# Patient Record
Sex: Female | Born: 1995 | Hispanic: No | Marital: Single | Smoking: Never smoker
Health system: Southern US, Community
[De-identification: ages and names within clinical notes are randomized; demographics above are authoritative.]

## PROBLEM LIST (undated history)

## (undated) DIAGNOSIS — Z789 Other specified health status: Secondary | ICD-10-CM

## (undated) HISTORY — PX: TONSILLECTOMY: SUR1361

## (undated) HISTORY — DX: Other specified health status: Z78.9

---

## 2018-04-01 DIAGNOSIS — R634 Abnormal weight loss: Secondary | ICD-10-CM | POA: Diagnosis not present

## 2018-04-01 DIAGNOSIS — Z118 Encounter for screening for other infectious and parasitic diseases: Secondary | ICD-10-CM | POA: Diagnosis not present

## 2018-04-01 DIAGNOSIS — Z6830 Body mass index (BMI) 30.0-30.9, adult: Secondary | ICD-10-CM | POA: Diagnosis not present

## 2018-04-01 DIAGNOSIS — N942 Vaginismus: Secondary | ICD-10-CM | POA: Diagnosis not present

## 2018-05-06 DIAGNOSIS — R350 Frequency of micturition: Secondary | ICD-10-CM | POA: Diagnosis not present

## 2018-05-06 DIAGNOSIS — M6281 Muscle weakness (generalized): Secondary | ICD-10-CM | POA: Diagnosis not present

## 2018-05-06 DIAGNOSIS — M62838 Other muscle spasm: Secondary | ICD-10-CM | POA: Diagnosis not present

## 2018-05-06 DIAGNOSIS — N941 Unspecified dyspareunia: Secondary | ICD-10-CM | POA: Diagnosis not present

## 2018-05-12 DIAGNOSIS — J209 Acute bronchitis, unspecified: Secondary | ICD-10-CM | POA: Diagnosis not present

## 2018-05-14 ENCOUNTER — Emergency Department (HOSPITAL_COMMUNITY)
Admission: EM | Admit: 2018-05-14 | Discharge: 2018-05-14 | Disposition: A | Discharge status: Home / Self Care | Payer: Federal, State, Local not specified - PPO | Attending: Emergency Medicine | Admitting: Emergency Medicine

## 2018-05-14 ENCOUNTER — Other Ambulatory Visit: Payer: Self-pay

## 2018-05-14 ENCOUNTER — Encounter (HOSPITAL_COMMUNITY): Payer: Self-pay | Admitting: *Deleted

## 2018-05-14 DIAGNOSIS — R059 Cough, unspecified: Secondary | ICD-10-CM

## 2018-05-14 DIAGNOSIS — R05 Cough: Secondary | ICD-10-CM | POA: Diagnosis not present

## 2018-05-14 MED ORDER — BENZONATATE 100 MG PO CAPS: 200.0000 mg | ORAL_CAPSULE | Freq: Two times a day (BID) | ORAL | 0 refills | Status: DC | PRN

## 2018-05-14 MED ORDER — ALBUTEROL SULFATE HFA 108 (90 BASE) MCG/ACT IN AERS
2.0000 | INHALATION_SPRAY | RESPIRATORY_TRACT | Status: DC | PRN
  Administered 2018-05-14: 2 via RESPIRATORY_TRACT
  Filled 2018-05-14: qty 6.7

## 2018-05-14 NOTE — ED Triage Notes (Signed)
Pt stated "I went to [facility] [employer]. Yesterday and was told I have acute bronchitis.  I was given Z-pack and promethazine DM.  I feel like it's not working."  LS clear.

## 2018-05-14 NOTE — Discharge Instructions (Signed)
If you run a fever greater than 100.3 degrees, you will need to isolate for 72 hours or until your fever has resolved for greater than 72 hours.  You do not meet screening criteria for coronavirus.  Please monitor symptoms carefully and return if your symptoms worsen.

## 2018-05-14 NOTE — ED Provider Notes (Signed)
[facility]-EMERGENCY DEPT Provider Note   CSN: [id number] Arrival date & time: 05/14/18  0114    History   Chief Complaint Chief Complaint  Patient presents with  . URI    HPI Donna Mcfarland is a 23 y.o. female.     Patient presents to the emergency department with a chief complaint of cough.  She states that she was seen yesterday at [facility] and was diagnosed with bronchitis.  She had a chest x-ray performed at that time.  She states that she has had diarrhea after starting the antibiotic.  She denies any fever.  Denies any other associated symptoms.  She states that she has not had any relief of her symptoms.  Denies any known exposures to COVID-19.  The history is provided by the patient. No language interpreter was used.    History reviewed. No pertinent past medical history.  There are no active problems to display for this patient.   History reviewed. No pertinent surgical history.   OB History   No obstetric history on file.      Home Medications    Prior to Admission medications   Medication Sig Start Date End Date Taking? Authorizing Provider  benzonatate (TESSALON) 100 MG capsule Take 2 capsules (200 mg total) by mouth 2 (two) times daily as needed for cough. 05/14/18   Roxy Horseman, PA-C    Family History No family history on file.  Social History Social History   Tobacco Use  . Smoking status: Never Smoker  . Smokeless tobacco: Never Used  Substance Use Topics  . Alcohol use: Yes    Comment: socially  . Drug use: Yes    Frequency: 3.0 times per week    Types: Marijuana     Allergies   Patient has no allergy information on record.   Review of Systems Review of Systems  All other systems reviewed and are negative.    Physical Exam Updated Vital Signs BP 119/71 (BP Location: Left Arm)   Pulse 93   Temp 99.4 F (37.4 C) (Oral) Comment: 500mg  ibprofen 3 hours ago  Resp 18   Ht 5\' 5"  (1.651 m)   Wt  70.3 kg   LMP 04/04/2018 (Approximate)   SpO2 98%   BMI 25.79 kg/m   Physical Exam Physical Exam  Constitutional: Pt  is oriented to person, place, and time. Appears well-developed and well-nourished. No distress.  HENT:  Head: Normocephalic and atraumatic.  Right Ear: Tympanic membrane, external ear and ear canal normal.  Left Ear: Tympanic membrane, external ear and ear canal normal.  Nose: Mucosal edema and mild rhinorrhea present. No epistaxis. Right sinus exhibits no maxillary sinus tenderness and no frontal sinus tenderness. Left sinus exhibits no maxillary sinus tenderness and no frontal sinus tenderness.  Mouth/Throat: Uvula is midline and mucous membranes are normal. Mucous membranes are not pale and not cyanotic. No oropharyngeal exudate, posterior oropharyngeal edema, posterior oropharyngeal erythema or tonsillar abscesses.  Eyes: Conjunctivae are normal. Pupils are equal, round, and reactive to light.  Neck: Normal range of motion and full passive range of motion without pain.  Cardiovascular: Normal rate and intact distal pulses.   Pulmonary/Chest: Effort normal and breath sounds normal. No stridor.  Clear and equal breath sounds without focal wheezes, rhonchi, rales  Abdominal: Soft. Bowel sounds are normal. There is no tenderness.  Musculoskeletal: Normal range of motion.  Lymphadenopathy:    Pthas no cervical adenopathy.  Neurological: Pt is alert and oriented to  person, place, and time.  Skin: Skin is warm and dry. No rash noted. Pt is not diaphoretic.  Psychiatric: Normal mood and affect.  Nursing note and vitals reviewed.    ED Treatments / Results  Labs (all labs ordered are listed, but only abnormal results are displayed) Labs Reviewed - No data to display  EKG None  Radiology No results found.  Procedures Procedures (including critical care time)  Medications Ordered in ED Medications  albuterol (PROVENTIL HFA;VENTOLIN HFA) 108 (90 Base) MCG/ACT  inhaler 2 puff (has no administration in time range)     Initial Impression / Assessment and Plan / ED Course  I have reviewed the triage vital signs and the nursing notes.  Pertinent labs & imaging results that were available during my care of the patient were reviewed by me and considered in my medical decision making (see chart for details).        Patient with cough.  Had chest x-ray performed at urgent care and was prescribed azithromycin.  She is not hypoxic, she is afebrile.  I have a low suspicion for coronavirus.  Instructions to self isolate until she feels better or until 72 hours after resolution of any possible fever.  Final Clinical Impressions(s) / ED Diagnoses   Final diagnoses:  Cough    ED Discharge Orders         Ordered    benzonatate (TESSALON) 100 MG capsule  2 times daily PRN     05/14/18 0224           Roxy Horseman, PA-C 05/14/18 7902    Derwood Kaplan, MD 05/14/18 4097

## 2018-05-24 DIAGNOSIS — L03011 Cellulitis of right finger: Secondary | ICD-10-CM | POA: Diagnosis not present

## 2018-06-01 ENCOUNTER — Other Ambulatory Visit: Payer: Self-pay

## 2018-06-01 DIAGNOSIS — Z20828 Contact with and (suspected) exposure to other viral communicable diseases: Secondary | ICD-10-CM | POA: Insufficient documentation

## 2018-06-01 DIAGNOSIS — J111 Influenza due to unidentified influenza virus with other respiratory manifestations: Secondary | ICD-10-CM | POA: Diagnosis not present

## 2018-06-01 DIAGNOSIS — R000 Tachycardia, unspecified: Secondary | ICD-10-CM | POA: Diagnosis not present

## 2018-06-01 DIAGNOSIS — R509 Fever, unspecified: Secondary | ICD-10-CM | POA: Diagnosis not present

## 2018-06-02 ENCOUNTER — Other Ambulatory Visit: Payer: Self-pay

## 2018-06-02 ENCOUNTER — Encounter (HOSPITAL_COMMUNITY): Payer: Self-pay | Admitting: *Deleted

## 2018-06-02 ENCOUNTER — Emergency Department (HOSPITAL_COMMUNITY): Payer: Federal, State, Local not specified - PPO

## 2018-06-02 ENCOUNTER — Emergency Department (HOSPITAL_COMMUNITY)
Admission: EM | Admit: 2018-06-02 | Discharge: 2018-06-02 | Disposition: A | Discharge status: Home / Self Care | Payer: Federal, State, Local not specified - PPO | Attending: Emergency Medicine | Admitting: Emergency Medicine

## 2018-06-02 DIAGNOSIS — R69 Illness, unspecified: Secondary | ICD-10-CM

## 2018-06-02 DIAGNOSIS — R509 Fever, unspecified: Secondary | ICD-10-CM | POA: Diagnosis not present

## 2018-06-02 DIAGNOSIS — J111 Influenza due to unidentified influenza virus with other respiratory manifestations: Secondary | ICD-10-CM

## 2018-06-02 LAB — CBC WITH DIFFERENTIAL/PLATELET
Abs Immature Granulocytes: 0.03 10*3/uL (ref 0.00–0.07)
Basophils Absolute: 0 10*3/uL (ref 0.0–0.1)
Basophils Relative: 0 %
Eosinophils Absolute: 0.1 10*3/uL (ref 0.0–0.5)
Eosinophils Relative: 1 %
HCT: 39.2 % (ref 36.0–46.0)
Hemoglobin: 12.9 g/dL (ref 12.0–15.0)
Immature Granulocytes: 0 %
Lymphocytes Relative: 5 %
Lymphs Abs: 0.5 10*3/uL — ABNORMAL LOW (ref 0.7–4.0)
MCH: 29.6 pg (ref 26.0–34.0)
MCHC: 32.9 g/dL (ref 30.0–36.0)
MCV: 89.9 fL (ref 80.0–100.0)
Monocytes Absolute: 0.2 10*3/uL (ref 0.1–1.0)
Monocytes Relative: 2 %
Neutro Abs: 8.1 10*3/uL — ABNORMAL HIGH (ref 1.7–7.7)
Neutrophils Relative %: 92 %
Platelets: 240 10*3/uL (ref 150–400)
RBC: 4.36 MIL/uL (ref 3.87–5.11)
RDW: 12.7 % (ref 11.5–15.5)
WBC: 9 10*3/uL (ref 4.0–10.5)
nRBC: 0 % (ref 0.0–0.2)

## 2018-06-02 LAB — COMPREHENSIVE METABOLIC PANEL
ALT: 14 U/L (ref 0–44)
AST: 16 U/L (ref 15–41)
Albumin: 4.4 g/dL (ref 3.5–5.0)
Alkaline Phosphatase: 41 U/L (ref 38–126)
Anion gap: 6 (ref 5–15)
BUN: 8 mg/dL (ref 6–20)
CO2: 26 mmol/L (ref 22–32)
Calcium: 8.9 mg/dL (ref 8.9–10.3)
Chloride: 104 mmol/L (ref 98–111)
Creatinine, Ser: 0.86 mg/dL (ref 0.44–1.00)
GFR calc Af Amer: >60 mL/min (ref >60)
GFR calc non Af Amer: >60 mL/min (ref >60)
Glucose, Bld: 116 mg/dL — ABNORMAL HIGH (ref 70–99)
Potassium: 3.7 mmol/L (ref 3.5–5.1)
Sodium: 136 mmol/L (ref 135–145)
Total Bilirubin: 0.4 mg/dL (ref 0.3–1.2)
Total Protein: 7.2 g/dL (ref 6.5–8.1)

## 2018-06-02 LAB — SARS CORONAVIRUS 2 BY RT PCR (HOSPITAL ORDER, PERFORMED IN ~~LOC~~ HOSPITAL LAB): SARS Coronavirus 2: NEGATIVE

## 2018-06-02 LAB — C-REACTIVE PROTEIN: CRP: 0.8 mg/dL (ref <1.0)

## 2018-06-02 LAB — INFLUENZA PANEL BY PCR (TYPE A & B)
Influenza A By PCR: NEGATIVE
Influenza B By PCR: NEGATIVE

## 2018-06-02 MED ORDER — ONDANSETRON HCL 4 MG/2ML IJ SOLN
4.0000 mg | Freq: Once | INTRAMUSCULAR | Status: AC
  Administered 2018-06-02: 4 mg via INTRAVENOUS
  Filled 2018-06-02: qty 2

## 2018-06-02 MED ORDER — SODIUM CHLORIDE 0.9 % IV BOLUS
1000.0000 mL | Freq: Once | INTRAVENOUS | Status: AC
  Administered 2018-06-02: 1000 mL via INTRAVENOUS

## 2018-06-02 MED ORDER — ACETAMINOPHEN 325 MG PO TABS
650.0000 mg | ORAL_TABLET | Freq: Once | ORAL | Status: AC
  Administered 2018-06-02: 650 mg via ORAL
  Filled 2018-06-02: qty 2

## 2018-06-02 MED ORDER — ALBUTEROL SULFATE HFA 108 (90 BASE) MCG/ACT IN AERS
2.0000 | INHALATION_SPRAY | Freq: Once | RESPIRATORY_TRACT | Status: AC
  Administered 2018-06-02: 2 via RESPIRATORY_TRACT
  Filled 2018-06-02: qty 6.7

## 2018-06-02 NOTE — Discharge Instructions (Addendum)
You were tested for COVID-19 tonight, you should stay in home isolation until you get the results.  Rest.  Drink plenty of fluids.  Take acetaminophen as needed for fever. Do not use ibuprofen.  You may return to work if you are no longer running a fever, and your COVID-19 test is negative. If the COVID-19 test is positive, you may return to work once you have gone three days without a fever AND without suing acetaminophen.

## 2018-06-02 NOTE — ED Triage Notes (Signed)
Pt c/o flu like symptoms a couple of days.  Pt stated "the nausea, the chills started @ 2030."

## 2018-06-02 NOTE — ED Provider Notes (Signed)
[facility] Provider Note   CSN: [id number] Arrival date & time: 06/01/18  2352    History   Chief Complaint Chief Complaint  Patient presents with  . flu like symptoms    HPI Donna Mcfarland is a 23 y.o. female.  The history is provided by the patient.  She had onset this evening of subjective fever, chills, nonproductive cough.  She also had some aching in her back.  She denies dyspnea.  She is complaining of nausea but has not vomited.  There has been no diarrhea.  She was recently treated for bronchitis, but that had resolved.  She did use an albuterol inhaler earlier tonight and did give her some temporary relief.  She also took a dose of acetaminophen.  She denies travel or exposure to someone with known COVI-19, but does work at [employer] and is exposed to numerous people during the course of the day.  History reviewed. No pertinent past medical history.  There are no active problems to display for this patient.   History reviewed. No pertinent surgical history.   OB History   No obstetric history on file.      Home Medications    Prior to Admission medications   Medication Sig Start Date End Date Taking? Authorizing Provider  benzonatate (TESSALON) 100 MG capsule Take 2 capsules (200 mg total) by mouth 2 (two) times daily as needed for cough. 05/14/18   Roxy HorsemanBrowning, Robert, PA-C    Family History No family history on file.  Social History Social History   Tobacco Use  . Smoking status: Never Smoker  . Smokeless tobacco: Never Used  Substance Use Topics  . Alcohol use: Yes    Comment: socially  . Drug use: Yes    Frequency: 3.0 times per week    Types: Marijuana     Allergies   Patient has no allergy information on record.   Review of Systems Review of Systems  All other systems reviewed and are negative.    Physical Exam Updated Vital Signs BP 117/80 (BP Location: Left Arm)   Pulse (!) 120   Temp (!)  100.8 F (38.2 C) (Oral)   Resp 15   Ht 5\' 5"  (1.651 m)   Wt 85.3 kg   LMP 05/19/2018 (Approximate)   SpO2 100%   BMI 31.28 kg/m   Physical Exam Vitals signs and nursing note reviewed.    23 year old female, resting comfortably and in no acute distress. Vital signs are significant for fever and rapid heart rate. Oxygen saturation is 100%, which is normal. Head is normocephalic and atraumatic. PERRLA, EOMI. Oropharynx is clear. Neck is nontender and supple without adenopathy or JVD. Back is nontender and there is no CVA tenderness. Lungs are clear without rales, wheezes, or rhonchi. Chest is nontender. Heart is tachycardic without murmur. Abdomen is soft, flat, nontender without masses or hepatosplenomegaly and peristalsis is normoactive. Extremities have no cyanosis or edema, full range of motion is present. Skin is warm and dry without rash. Neurologic: Mental status is normal, cranial nerves are intact, there are no motor or sensory deficits.  ED Treatments / Results  Labs (all labs ordered are listed, but only abnormal results are displayed) Labs Reviewed  COMPREHENSIVE METABOLIC PANEL - Abnormal; Notable for the following components:      Result Value   Glucose, Bld 116 (*)    All other components within normal limits  CBC WITH DIFFERENTIAL/PLATELET - Abnormal; Notable for the following  components:   Neutro Abs 8.1 (*)    Lymphs Abs 0.5 (*)    All other components within normal limits  SARS CORONAVIRUS 2 (HOSPITAL ORDER, PERFORMED IN [facility])  C-REACTIVE PROTEIN  INFLUENZA PANEL BY PCR (TYPE A & B)    EKG EKG Interpretation  Date/Time:  Tuesday June 02 2018 00:59:08 EDT Ventricular Rate:  108 PR Interval:    QRS Duration: 66 QT Interval:  312 QTC Calculation: 419 R Axis:   82 Text Interpretation:  Sinus tachycardia RSR' in V1 or V2, probably normal variant Otherwise within normal limits No old tracing to compare Confirmed by Dione Booze  ([id number]) on 06/02/2018 1:07:46 AM   Radiology Dg Chest Port 1 View  Result Date: 06/02/2018 CLINICAL DATA:  Nausea and fevers EXAM: PORTABLE CHEST 1 VIEW COMPARISON:  None. FINDINGS: The heart size and mediastinal contours are within normal limits. Both lungs are clear. The visualized skeletal structures are unremarkable. IMPRESSION: No active disease. Electronically Signed   By: Alcide Clever M.D.   On: 06/02/2018 01:04    Procedures Procedures  Medications Ordered in ED Medications  sodium chloride 0.9 % bolus 1,000 mL (0 mLs Intravenous Stopped 06/02/18 0257)  albuterol (PROVENTIL HFA;VENTOLIN HFA) 108 (90 Base) MCG/ACT inhaler 2 puff (2 puffs Inhalation Given 06/02/18 0115)  ondansetron (ZOFRAN) injection 4 mg (4 mg Intravenous Given 06/02/18 0116)  acetaminophen (TYLENOL) tablet 650 mg (650 mg Oral Given 06/02/18 0231)     Initial Impression / Assessment and Plan / ED Course  I have reviewed the triage vital signs and the nursing notes.  Pertinent labs & imaging results that were available during my care of the patient were reviewed by me and considered in my medical decision making (see chart for details).  Influenza-like illness.  Rapidity of onset favors influenza rather than COVID-19.  Will check chest x-ray.  She will be given IV fluids and additional albuterol.  Because COVID-19 is in the differential, will avoid use of NSAIDs.  She will be given ondansetron for nausea.  Chest x-ray shows no evidence of pneumonia.  ECG shows sinus tachycardia but is otherwise unremarkable.  Labs do show mild lymphopenia which could be consistent with COVID-19.  Influenza screen is negative.  She feels better after above-noted treatment.  I still feel that, clinically, this is not a coronavirus infection.  However, swab is sent for testing.  She is advised to continue taking acetaminophen at home, avoid ibuprofen.  Instructed to not return to work until she is afebrile with a negative coronavirus test,  or 3 days without fever with no antipyretic use if positive for coronavirus.  Return for difficulty breathing or confusion.  Final Clinical Impressions(s) / ED Diagnoses   Final diagnoses:  Influenza-like illness    ED Discharge Orders    None       Dione Booze, MD 06/02/18 [phone]

## 2018-06-22 ENCOUNTER — Ambulatory Visit
Admission: EM | Admit: 2018-06-22 | Discharge: 2018-06-22 | Disposition: A | Discharge status: Home / Self Care | Payer: Federal, State, Local not specified - PPO | Attending: Physician Assistant | Admitting: Physician Assistant

## 2018-06-22 ENCOUNTER — Other Ambulatory Visit: Payer: Self-pay

## 2018-06-22 DIAGNOSIS — J209 Acute bronchitis, unspecified: Secondary | ICD-10-CM | POA: Diagnosis not present

## 2018-06-22 MED ORDER — DOXYCYCLINE HYCLATE 100 MG PO CAPS: 100.0000 mg | ORAL_CAPSULE | Freq: Two times a day (BID) | ORAL | 0 refills | Status: DC

## 2018-06-22 NOTE — ED Triage Notes (Signed)
Pt states dx'd with bronchitis 3.5 wks ago. Went to ER for cough and fever 2wks ago and had neg. PNA, FLU, and COVID 19 test. Pt c/o cough and chest congestion today

## 2018-06-22 NOTE — ED Provider Notes (Signed)
[name]    CSN: [id number] Arrival date & time: 06/22/18  1328     History   Chief Complaint Chief Complaint  Patient presents with  . Cough    HPI Donna Mcfarland is a 23 y.o. female.   The history is provided by the patient. No language interpreter was used.  Cough  Cough characteristics:  Non-productive Sputum characteristics:  Nondescript Severity:  Moderate Onset quality:  Gradual Duration:  3 weeks Timing:  Constant Progression:  Worsening Chronicity:  New Smoker: no   Context: upper respiratory infection   Relieved by:  Nothing Worsened by:  Nothing Ineffective treatments:  None tried Associated symptoms: fever   Associated symptoms: no chest pain     History reviewed. No pertinent past medical history.  There are no active problems to display for this patient.   History reviewed. No pertinent surgical history.  OB History   No obstetric history on file.      Home Medications    Prior to Admission medications   Medication Sig Start Date End Date Taking? Authorizing Provider  acetaminophen (TYLENOL) 500 MG tablet Take 500 mg by mouth every 6 (six) hours as needed for mild pain.    [provider]  doxycycline (VIBRAMYCIN) 100 MG capsule Take 1 capsule (100 mg total) by mouth 2 (two) times daily. 06/22/18   Elson Areas, PA-C  sulfamethoxazole-trimethoprim (BACTRIM DS,SEPTRA DS) 800-160 MG tablet Take 1 tablet by mouth 2 (two) times daily. 05/24/18   [provider]    Family History No family history on file.  Social History Social History   Tobacco Use  . Smoking status: Never Smoker  . Smokeless tobacco: Never Used  Substance Use Topics  . Alcohol use: Yes    Comment: socially  . Drug use: Yes    Frequency: 3.0 times per week    Types: Marijuana     Allergies   Patient has no known allergies.   Review of Systems Review of Systems  Constitutional: Positive for fever.  Respiratory:  Positive for cough.   Cardiovascular: Negative for chest pain.  All other systems reviewed and are negative.    Physical Exam Triage Vital Signs ED Triage Vitals  Enc Vitals Group     BP 06/22/18 1337 137/74     Pulse Rate 06/22/18 1337 74     Resp 06/22/18 1337 18     Temp 06/22/18 1337 99.3 F (37.4 C)     Temp Source 06/22/18 1337 Oral     SpO2 06/22/18 1337 98 %     Weight --      Height --      Head Circumference --      Peak Flow --      Pain Score 06/22/18 1338 2     Pain Loc --      Pain Edu? --      Excl. in GC? --    No data found.  Updated Vital Signs BP 137/74 (BP Location: Left Arm)   Pulse 74   Temp 99.3 F (37.4 C) (Oral)   Resp 18   LMP 05/23/2018   SpO2 98%   Visual Acuity Right Eye Distance:   Left Eye Distance:   Bilateral Distance:    Right Eye Near:   Left Eye Near:    Bilateral Near:     Physical Exam Vitals signs reviewed.  HENT:     Head: Normocephalic.     Right Ear: Tympanic membrane  normal.     Left Ear: Tympanic membrane normal.     Nose: Nose normal.     Mouth/Throat:     Mouth: Mucous membranes are moist.  Eyes:     Extraocular Movements: Extraocular movements intact.     Pupils: Pupils are equal, round, and reactive to light.  Neck:     Musculoskeletal: Normal range of motion.  Cardiovascular:     Rate and Rhythm: Normal rate.     Pulses: Normal pulses.  Pulmonary:     Effort: Pulmonary effort is normal.  Abdominal:     General: Abdomen is flat.  Musculoskeletal: Normal range of motion.  Skin:    General: Skin is warm.  Neurological:     General: No focal deficit present.     Mental Status: She is alert.  Psychiatric:        Mood and Affect: Mood normal.      UC Treatments / Results  Labs (all labs ordered are listed, but only abnormal results are displayed) Labs Reviewed - No data to display  EKG None  Radiology No results found.  Procedures Procedures (including critical care time)   Medications Ordered in UC Medications - No data to display  Initial Impression / Assessment and Plan / UC Course  I have reviewed the triage vital signs and the nursing notes.  Pertinent labs & imaging results that were available during my care of the patient were reviewed by me and considered in my medical decision making (see chart for details).     MDM  Pt given rx for doxycycline.  Pt advised to use albuterol 2 puffs every 4 hours  Final Clinical Impressions(s) / UC Diagnoses   Final diagnoses:  Acute bronchitis, unspecified organism     Discharge Instructions     Use your inhaler 2 puffs every 4 hours.    ED Prescriptions    Medication Sig Dispense Auth. Provider   doxycycline (VIBRAMYCIN) 100 MG capsule Take 1 capsule (100 mg total) by mouth 2 (two) times daily. 20 capsule Elson AreasSofia, Bobby Ragan K, [place]-C     Controlled Substance Prescriptions [place] Controlled Substance Registry consulted? Not Applicable  An After Visit Summary was printed and given to the patient.    Elson AreasSofia, Kurstyn Larios K, [place]-C 06/22/18 1359

## 2018-06-22 NOTE — Discharge Instructions (Addendum)
Use your inhaler 2 puffs every 4 hours

## 2018-10-10 DIAGNOSIS — H6503 Acute serous otitis media, bilateral: Secondary | ICD-10-CM | POA: Diagnosis not present

## 2018-10-10 DIAGNOSIS — J0100 Acute maxillary sinusitis, unspecified: Secondary | ICD-10-CM | POA: Diagnosis not present

## 2018-11-12 DIAGNOSIS — N942 Vaginismus: Secondary | ICD-10-CM | POA: Diagnosis not present

## 2018-11-12 DIAGNOSIS — M6281 Muscle weakness (generalized): Secondary | ICD-10-CM | POA: Diagnosis not present

## 2018-11-12 DIAGNOSIS — R350 Frequency of micturition: Secondary | ICD-10-CM | POA: Diagnosis not present

## 2018-11-12 DIAGNOSIS — M62838 Other muscle spasm: Secondary | ICD-10-CM | POA: Diagnosis not present

## 2018-12-24 DIAGNOSIS — R591 Generalized enlarged lymph nodes: Secondary | ICD-10-CM | POA: Diagnosis not present

## 2018-12-24 DIAGNOSIS — R634 Abnormal weight loss: Secondary | ICD-10-CM | POA: Diagnosis not present

## 2019-01-13 DIAGNOSIS — M62838 Other muscle spasm: Secondary | ICD-10-CM | POA: Diagnosis not present

## 2019-01-13 DIAGNOSIS — N941 Unspecified dyspareunia: Secondary | ICD-10-CM | POA: Diagnosis not present

## 2019-01-13 DIAGNOSIS — M6281 Muscle weakness (generalized): Secondary | ICD-10-CM | POA: Diagnosis not present

## 2019-01-13 DIAGNOSIS — R350 Frequency of micturition: Secondary | ICD-10-CM | POA: Diagnosis not present

## 2019-01-20 ENCOUNTER — Emergency Department (HOSPITAL_COMMUNITY): Payer: BC Managed Care – PPO

## 2019-01-20 ENCOUNTER — Emergency Department (HOSPITAL_COMMUNITY)
Admission: EM | Admit: 2019-01-20 | Discharge: 2019-01-20 | Disposition: A | Discharge status: Home / Self Care | Payer: BC Managed Care – PPO | Attending: Emergency Medicine | Admitting: Emergency Medicine

## 2019-01-20 ENCOUNTER — Encounter (HOSPITAL_COMMUNITY): Payer: Self-pay | Admitting: Emergency Medicine

## 2019-01-20 ENCOUNTER — Ambulatory Visit (HOSPITAL_COMMUNITY)
Admission: EM | Admit: 2019-01-20 | Discharge: 2019-01-20 | Disposition: A | Discharge status: Home / Self Care | Payer: BC Managed Care – PPO | Source: Home / Self Care | Attending: Family Medicine | Admitting: Family Medicine

## 2019-01-20 ENCOUNTER — Other Ambulatory Visit: Payer: Self-pay

## 2019-01-20 DIAGNOSIS — R1030 Lower abdominal pain, unspecified: Secondary | ICD-10-CM

## 2019-01-20 DIAGNOSIS — Z20828 Contact with and (suspected) exposure to other viral communicable diseases: Secondary | ICD-10-CM | POA: Diagnosis not present

## 2019-01-20 DIAGNOSIS — R112 Nausea with vomiting, unspecified: Secondary | ICD-10-CM

## 2019-01-20 DIAGNOSIS — R1011 Right upper quadrant pain: Secondary | ICD-10-CM | POA: Insufficient documentation

## 2019-01-20 DIAGNOSIS — K529 Noninfective gastroenteritis and colitis, unspecified: Secondary | ICD-10-CM | POA: Diagnosis not present

## 2019-01-20 DIAGNOSIS — N390 Urinary tract infection, site not specified: Secondary | ICD-10-CM | POA: Diagnosis not present

## 2019-01-20 DIAGNOSIS — R197 Diarrhea, unspecified: Secondary | ICD-10-CM | POA: Insufficient documentation

## 2019-01-20 DIAGNOSIS — F1290 Cannabis use, unspecified, uncomplicated: Secondary | ICD-10-CM | POA: Diagnosis not present

## 2019-01-20 DIAGNOSIS — R195 Other fecal abnormalities: Secondary | ICD-10-CM

## 2019-01-20 DIAGNOSIS — R1031 Right lower quadrant pain: Secondary | ICD-10-CM | POA: Insufficient documentation

## 2019-01-20 LAB — URINALYSIS, ROUTINE W REFLEX MICROSCOPIC
Bilirubin Urine: NEGATIVE
Glucose, UA: NEGATIVE mg/dL
Hgb urine dipstick: NEGATIVE
Ketones, ur: NEGATIVE mg/dL
Nitrite: NEGATIVE
Protein, ur: NEGATIVE mg/dL
Specific Gravity, Urine: 1.005 (ref 1.005–1.030)
pH: 6 (ref 5.0–8.0)

## 2019-01-20 LAB — COMPREHENSIVE METABOLIC PANEL
ALT: 13 U/L (ref 0–44)
AST: 12 U/L — ABNORMAL LOW (ref 15–41)
Albumin: 4.1 g/dL (ref 3.5–5.0)
Alkaline Phosphatase: 46 U/L (ref 38–126)
Anion gap: 10 (ref 5–15)
BUN: 7 mg/dL (ref 6–20)
CO2: 25 mmol/L (ref 22–32)
Calcium: 9.3 mg/dL (ref 8.9–10.3)
Chloride: 102 mmol/L (ref 98–111)
Creatinine, Ser: 0.75 mg/dL (ref 0.44–1.00)
GFR calc Af Amer: >60 mL/min (ref >60)
GFR calc non Af Amer: >60 mL/min (ref >60)
Glucose, Bld: 91 mg/dL (ref 70–99)
Potassium: 3.6 mmol/L (ref 3.5–5.1)
Sodium: 137 mmol/L (ref 135–145)
Total Bilirubin: 0.5 mg/dL (ref 0.3–1.2)
Total Protein: 7.3 g/dL (ref 6.5–8.1)

## 2019-01-20 LAB — CBC
HCT: 39.7 % (ref 36.0–46.0)
Hemoglobin: 13.5 g/dL (ref 12.0–15.0)
MCH: 30.1 pg (ref 26.0–34.0)
MCHC: 34 g/dL (ref 30.0–36.0)
MCV: 88.6 fL (ref 80.0–100.0)
Platelets: 312 10*3/uL (ref 150–400)
RBC: 4.48 MIL/uL (ref 3.87–5.11)
RDW: 12.1 % (ref 11.5–15.5)
WBC: 9.7 10*3/uL (ref 4.0–10.5)
nRBC: 0 % (ref 0.0–0.2)

## 2019-01-20 LAB — LIPASE, BLOOD: Lipase: 19 U/L (ref 11–51)

## 2019-01-20 LAB — I-STAT BETA HCG BLOOD, ED (MC, WL, AP ONLY): I-stat hCG, quantitative: <5 m[IU]/mL (ref <5)

## 2019-01-20 LAB — POC SARS CORONAVIRUS 2 AG -  ED: SARS Coronavirus 2 Ag: NEGATIVE

## 2019-01-20 MED ORDER — SODIUM CHLORIDE 0.9 % IV BOLUS
1000.0000 mL | Freq: Once | INTRAVENOUS | Status: AC
  Administered 2019-01-20: 1000 mL via INTRAVENOUS

## 2019-01-20 MED ORDER — ONDANSETRON 4 MG PO TBDP
4.0000 mg | ORAL_TABLET | Freq: Once | ORAL | Status: AC
  Administered 2019-01-20: 4 mg via ORAL

## 2019-01-20 MED ORDER — IOHEXOL 300 MG/ML  SOLN
100.0000 mL | Freq: Once | INTRAMUSCULAR | Status: AC | PRN
  Administered 2019-01-20: 100 mL via INTRAVENOUS

## 2019-01-20 MED ORDER — KETOROLAC TROMETHAMINE 30 MG/ML IJ SOLN
30.0000 mg | Freq: Once | INTRAMUSCULAR | Status: AC
  Administered 2019-01-20: 30 mg via INTRAVENOUS
  Filled 2019-01-20: qty 1

## 2019-01-20 MED ORDER — SODIUM CHLORIDE 0.9% FLUSH
3.0000 mL | Freq: Once | INTRAVENOUS | Status: AC
  Administered 2019-01-20: 3 mL via INTRAVENOUS

## 2019-01-20 MED ORDER — DICYCLOMINE HCL 10 MG PO CAPS
10.0000 mg | ORAL_CAPSULE | Freq: Once | ORAL | Status: AC
  Administered 2019-01-20: 10 mg via ORAL
  Filled 2019-01-20: qty 1

## 2019-01-20 MED ORDER — MORPHINE SULFATE (PF) 4 MG/ML IV SOLN
4.0000 mg | Freq: Once | INTRAVENOUS | Status: AC
  Administered 2019-01-20: 4 mg via INTRAVENOUS
  Filled 2019-01-20: qty 1

## 2019-01-20 MED ORDER — DICYCLOMINE HCL 20 MG PO TABS: 20.0000 mg | ORAL_TABLET | Freq: Two times a day (BID) | ORAL | 0 refills | Status: DC

## 2019-01-20 MED ORDER — CEPHALEXIN 500 MG PO CAPS: 500.0000 mg | ORAL_CAPSULE | Freq: Two times a day (BID) | ORAL | 0 refills | Status: DC

## 2019-01-20 MED ORDER — ONDANSETRON 4 MG PO TBDP
ORAL_TABLET | ORAL | Status: AC
  Filled 2019-01-20: qty 1

## 2019-01-20 MED ORDER — MORPHINE SULFATE (PF) 4 MG/ML IV SOLN
4.0000 mg | Freq: Once | INTRAVENOUS | Status: DC
  Filled 2019-01-20: qty 1

## 2019-01-20 MED ORDER — METOCLOPRAMIDE HCL 5 MG/ML IJ SOLN
10.0000 mg | Freq: Once | INTRAMUSCULAR | Status: AC
  Administered 2019-01-20: 10 mg via INTRAVENOUS
  Filled 2019-01-20: qty 2

## 2019-01-20 MED ORDER — ONDANSETRON HCL 4 MG PO TABS: 4.0000 mg | ORAL_TABLET | Freq: Four times a day (QID) | ORAL | 0 refills | Status: DC

## 2019-01-20 NOTE — ED Notes (Signed)
Sent a urine culture with the urine specimen 

## 2019-01-20 NOTE — ED Provider Notes (Signed)
[facility]   CSN: [id number] Arrival date & time: 01/20/19  1417     History   Chief Complaint Chief Complaint  Patient presents with  . Abdominal Pain    HPI Donna Mcfarland is a 23 y.o. female who is previously healthy who presents with a 3-day history of abdominal pain.  Patient has had pain mostly on the right side upper and lower.  She has had frequent diarrhea, vomiting.  Diarrhea and vomiting is nonbloody.  Patient describes the diarrhea is becoming yellow and green over the past day or so.  She has not been eating very much.  She is urinating less.  She was seen in urgent care and sent to rule out appendicitis or other emergent finding.  Patient denies any fever, chest pain, shortness of breath, urinary symptoms (other than decreased), abnormal vaginal bleeding or discharge.  Patient symptoms started after eating Donna Mcfarland.  Patient took Imodium prior to arrival.     HPI  No past medical history on file.  There are no active problems to display for this patient.   No past surgical history on file.   OB History   No obstetric history on file.      Home Medications    Prior to Admission medications   Medication Sig Start Date End Date Taking? Authorizing Provider  acetaminophen (TYLENOL) 500 MG tablet Take 500 mg by mouth every 6 (six) hours as needed for mild pain.    [provider]  cephALEXin (KEFLEX) 500 MG capsule Take 1 capsule (500 mg total) by mouth 2 (two) times daily. 01/20/19   Samaa Ueda, Waylan BogaAlexandra M, PA-C  dicyclomine (BENTYL) 20 MG tablet Take 1 tablet (20 mg total) by mouth 2 (two) times daily. 01/20/19   Ianmichael Amescua, Waylan BogaAlexandra M, PA-C  ondansetron (ZOFRAN) 4 MG tablet Take 1 tablet (4 mg total) by mouth every 6 (six) hours. 01/20/19   Emi HolesLaw, Brookes Craine M, PA-C    Family History Family History  Problem Relation Age of Onset  . Diabetes Mother   . Thyroid disease Mother   . Kidney Stones Mother    . Cholecystitis Mother   . Kidney Stones Father   . Healthy Brother   . Healthy Brother     Social History Social History   Tobacco Use  . Smoking status: Never Smoker  . Smokeless tobacco: Never Used  Substance Use Topics  . Alcohol use: Yes    Comment: socially  . Drug use: Yes    Frequency: 3.0 times per week    Types: Marijuana     Allergies   Patient has no known allergies.   Review of Systems Review of Systems  Constitutional: Negative for chills and fever.  HENT: Negative for facial swelling and sore throat.   Respiratory: Negative for shortness of breath.   Cardiovascular: Negative for chest pain.  Gastrointestinal: Positive for abdominal pain, diarrhea, nausea and vomiting. Negative for blood in stool.  Genitourinary: Positive for decreased urine volume. Negative for dysuria, vaginal bleeding and vaginal discharge.  Musculoskeletal: Negative for back pain.  Skin: Negative for rash and wound.  Neurological: Negative for headaches.  Psychiatric/Behavioral: The patient is not nervous/anxious.      Physical Exam Updated Vital Signs BP (!) 92/57 (BP Location: Left Arm)   Pulse (!) 57   Temp 98.4 F (36.9 C) (Oral)   Resp 16   Ht 5\' 5"  (1.651 m)   Wt 81.2 kg   LMP  01/06/2019 (Approximate) Comment: neg preg  SpO2 100%   BMI 29.79 kg/m   Physical Exam Vitals signs and nursing note reviewed.  Constitutional:      General: She is not in acute distress.    Appearance: She is well-developed. She is not diaphoretic.  HENT:     Head: Normocephalic and atraumatic.     Mouth/Throat:     Pharynx: No oropharyngeal exudate.  Eyes:     General: No scleral icterus.       Right eye: No discharge.        Left eye: No discharge.     Conjunctiva/sclera: Conjunctivae normal.     Pupils: Pupils are equal, round, and reactive to light.  Neck:     Musculoskeletal: Normal range of motion and neck supple.     Thyroid: No thyromegaly.  Cardiovascular:     Rate and  Rhythm: Normal rate and regular rhythm.     Heart sounds: Normal heart sounds. No murmur. No friction rub. No gallop.   Pulmonary:     Effort: Pulmonary effort is normal. No respiratory distress.     Breath sounds: Normal breath sounds. No stridor. No wheezing or rales.  Abdominal:     General: Bowel sounds are normal. There is no distension.     Palpations: Abdomen is soft.     Tenderness: There is abdominal tenderness in the right upper quadrant and right lower quadrant. There is right CVA tenderness. There is no guarding or rebound. Positive signs include Murphy's sign and McBurney's sign.  Genitourinary:    Rectum: Tenderness (Discomfort) present. No mass, anal fissure, external hemorrhoid or internal hemorrhoid. Normal anal tone.     Comments: No fecal impaction Lymphadenopathy:     Cervical: No cervical adenopathy.  Skin:    General: Skin is warm and dry.     Coloration: Skin is not pale.     Findings: No rash.  Neurological:     Mental Status: She is alert.     Coordination: Coordination normal.      ED Treatments / Results  Labs (all labs ordered are listed, but only abnormal results are displayed) Labs Reviewed  COMPREHENSIVE METABOLIC PANEL - Abnormal; Notable for the following components:      Result Value   AST 12 (*)    All other components within normal limits  URINALYSIS, ROUTINE W REFLEX MICROSCOPIC - Abnormal; Notable for the following components:   APPearance CLOUDY (*)    Leukocytes,Ua MODERATE (*)    Bacteria, UA RARE (*)    All other components within normal limits  GASTROINTESTINAL PANEL BY PCR, STOOL (REPLACES STOOL CULTURE)  C DIFFICILE QUICK SCREEN W PCR REFLEX  URINE CULTURE  LIPASE, BLOOD  CBC  I-STAT BETA HCG BLOOD, ED ([facility], [facility], [facility] ONLY)  POC SARS CORONAVIRUS 2 AG -  ED    EKG None  Radiology Ct Abdomen Pelvis W Contrast  Result Date: 01/20/2019 CLINICAL DATA:  Acute right lower quadrant abdominal pain. EXAM: CT ABDOMEN AND PELVIS WITH  CONTRAST TECHNIQUE: Multidetector CT imaging of the abdomen and pelvis was performed using the standard protocol following bolus administration of intravenous contrast. CONTRAST:  OMNIPAQUE IOHEXOL 300 MG/ML  SOLN COMPARISON:  None. FINDINGS: Lower chest: No acute abnormality. Hepatobiliary: No focal liver abnormality is seen. No gallstones, gallbladder wall thickening, or biliary dilatation. Pancreas: Unremarkable. No pancreatic ductal dilatation or surrounding inflammatory changes. Spleen: Normal in size without focal abnormality. Adrenals/Urinary Tract: Adrenal glands are unremarkable. Kidneys are normal,  without renal calculi, focal lesion, or hydronephrosis. Bladder is unremarkable. Stomach/Bowel: Stomach is within normal limits. Appendix appears normal. No evidence of bowel wall thickening, distention, or inflammatory changes. Vascular/Lymphatic: No significant vascular findings are present. No enlarged abdominal or pelvic lymph nodes. Reproductive: Uterus and bilateral adnexa are unremarkable. Other: No abdominal wall hernia or abnormality. No abdominopelvic ascites. Musculoskeletal: Bilateral L5 spondylolysis. No other abnormality seen in the visualized skeleton. IMPRESSION: Bilateral L5 spondylolysis. No other abnormality seen in the abdomen or pelvis. Electronically Signed   By: Marijo Conception M.D.   On: 01/20/2019 20:54    Procedures Procedures (including critical care time)  Medications Ordered in ED Medications  sodium chloride flush (NS) 0.9 % injection 3 mL (3 mLs Intravenous Given 01/20/19 1949)  sodium chloride 0.9 % bolus 1,000 mL (0 mLs Intravenous Stopped 01/20/19 1845)  metoCLOPramide (REGLAN) injection 10 mg (10 mg Intravenous Given 01/20/19 1704)  morphine 4 MG/ML injection 4 mg (4 mg Intravenous Given 01/20/19 1949)  iohexol (OMNIPAQUE) 300 MG/ML solution 100 mL (100 mLs Intravenous Contrast Given 01/20/19 2032)  dicyclomine (BENTYL) capsule 10 mg (10 mg Oral Given 01/20/19  2129)  ketorolac (TORADOL) 30 MG/ML injection 30 mg (30 mg Intravenous Given 01/20/19 2130)     Initial Impression / Assessment and Plan / ED Course  I have reviewed the triage vital signs and the nursing notes.  Pertinent labs & imaging results that were available during my care of the patient were reviewed by me and considered in my medical decision making (see chart for details).        Patient presenting with abdominal pain and diarrhea.  Labs are unremarkable except UA does have moderate leukocytes and 21-50 WBCs.  Will treat UTI with Keflex.  CT abdomen pelvis is negative.  Suspect a viral gastroenteritis versus infectious diarrhea as patient symptoms did start after Brendolyn Patty.  Patient feeling much better after IV fluids, Reglan, Toradol, Bentyl.  Will discharge home with Bentyl, Zofran.  Will refer to GI if symptoms not improving.  Advised to Imodium if possibility of infectious.  Good hydration discussed.  Return precautions discussed.  Patient understands and agrees with plan.  Patient vitals stable and discharged in satisfactory condition  Final Clinical Impressions(s) / ED Diagnoses   Final diagnoses:  Lower abdominal pain  Lower urinary tract infectious disease  Diarrhea of presumed infectious origin    ED Discharge Orders         Ordered    dicyclomine (BENTYL) 20 MG tablet  2 times daily     01/20/19 2220    ondansetron (ZOFRAN) 4 MG tablet  Every 6 hours     01/20/19 2220    cephALEXin (KEFLEX) 500 MG capsule  2 times daily     01/20/19 2220    Ambulatory referral to Gastroenterology     01/20/19 2220           Frederica Kuster, PA-C 01/20/19 2252    Drenda Freeze, MD 01/23/19 [phone]

## 2019-01-20 NOTE — ED Provider Notes (Signed)
[facility] CARE CENTER   [id number] 01/20/19 Arrival Time: 1157  ASSESSMENT & PLAN:  1. Lower abdominal pain   2. Intractable vomiting with nausea, unspecified vomiting type   3. Loose stools     Meds ordered this encounter  Medications  . ondansetron (ZOFRAN-ODT) disintegrating tablet 4 mg   Declines trial of Zofran, labs, U/A and UPT here.  She repeatedly tells me of intermittent unbearable abdominal pain, and after discussion, prefers to proceed to the ED for further evaluation. I cannot r/o appendicitis or other intra-abdominal process.  Discharge to the ED by private vehicle. Stable.  Reviewed expectations re: course of current medical issues. Questions answered. Outlined signs and symptoms indicating need for more acute intervention. Patient verbalized understanding. After Visit Summary given.   SUBJECTIVE: History from: patient.  Donna Mcfarland is a 23 y.o. female who presents with complaint of non-bilious, non-bloody intermittent n/v with non-bloody and watery "loose stools". "Have been problems with my stomach over the past year or so". Mother with IBS; questions similar symptoms. Onset of current symptoms abrupt, 3 days ago. Reports associated abdominal pain: intermittent and "cramping", worse with bowel movements; generalized without radiation. Symptoms are unchanged since beginning. Aggravating factors: PO intake. Alleviating factors: none identified. Associated symptoms: fatigue and "heartburn". She denies arthralgias, belching, chills, constipation, dysuria, headache, myalgias and sweats. Appetite: decreased. PO intake: decreased."Haven't really had anything to eat in the past three days". Ambulatory without assistance. Urinary symptoms: none. Sick contacts: none. Recent travel or camping: none. OTC treatment: none.  Patient's last menstrual period was 01/06/2019 (approximate). "No chance I'm pregnant".  History reviewed. No pertinent surgical history.   ROS: As per HPI. All other systems negative.   OBJECTIVE:  Vitals:   01/20/19 1325  BP: 108/73  Pulse: 75  Resp: 12  Temp: 98.9 F (37.2 C)  TempSrc: Oral  SpO2: 98%    General appearance: alert; no distress Oropharynx: moist Lungs: clear to auscultation bilaterally; unlabored Heart: regular rate and rhythm Abdomen: soft; non-distended; reports poorly localized tenderness to palpation of lower abdomen, R>L; bowel sounds present; no masses or organomegaly; no frank guarding or rebound tenderness Back: no CVA tenderness Extremities: no edema; symmetrical with no gross deformities Skin: warm; dry Neurologic: normal gait Psychological: alert and cooperative; normal mood and affect  Labs: Labs Reviewed  POC URINE PREG, ED  Patient declines.  No Known Allergies                                             PMH: "Stomach problems."  Social History   Socioeconomic History  . Marital status: Single    Spouse name: Not on file  . Number of children: Not on file  . Years of education: Not on file  . Highest education level: Not on file  Occupational History  . Not on file  Social Needs  . Financial resource strain: Not on file  . Food insecurity    Worry: Not on file    Inability: Not on file  . Transportation needs    Medical: Not on file    Non-medical: Not on file  Tobacco Use  . Smoking status: Never Smoker  . Smokeless tobacco: Never Used  Substance and Sexual Activity  . Alcohol use: Yes    Comment: socially  . Drug use: Yes    Frequency: 3.0 times per week  Types: Marijuana  . Sexual activity: Yes    Birth control/protection: None  Lifestyle  . Physical activity    Days per week: Not on file    Minutes per session: Not on file  . Stress: Not on file  Relationships  . Social Herbalist on phone: Not on file    Gets together: Not on file    Attends religious service: Not on file    Active member of club or organization: Not on file     Attends meetings of clubs or organizations: Not on file    Relationship status: Not on file  . Intimate partner violence    Fear of current or ex partner: Not on file    Emotionally abused: Not on file    Physically abused: Not on file    Forced sexual activity: Not on file  Other Topics Concern  . Not on file  Social History Narrative  . Not on file   Family History  Problem Relation Age of Onset  . Diabetes Mother   . Thyroid disease Mother   . Kidney Stones Mother   . Cholecystitis Mother   . Kidney Stones Father   . Healthy Brother   . Healthy Brother      Vanessa Kick, MD 01/20/19 [phone]

## 2019-01-20 NOTE — Discharge Instructions (Addendum)
Take Keflex as prescribed for urinary tract infection.  You will be called if you need to be changed based on your urine culture.  Take Bentyl twice daily as needed for abdominal cramping.  Take Zofran every 6 hours as needed for nausea or vomiting.  Make sure to stay well-hydrated.  Please follow-up with the GI doctor if your symptoms are not improving.  Please return the emergency department if you develop any significant bloody diarrhea, passing out, inability to urinate, severe worsening pain, fevers, or any other concerning symptoms.

## 2019-01-20 NOTE — ED Triage Notes (Addendum)
Pt reports a severe headache three days ago.  She took Tylenol and about 2 hours later she vomited.  She reports diarrhea since that time for the last three days.  She states when she eats soup, or drinks ginger ale or water she will have to have a bowel movement right away. She describes her diarrhea as watery, lime green with dark "fuzzy stuff in it."  She described her vomit as similar color and watery as well that lasted about three hours. Pt reports only urinating twice in the last two days.  She also describes abdominal cramping that gets worse whenever she has a bowel movement, bringing her to tears.

## 2019-01-20 NOTE — ED Triage Notes (Signed)
Pt arrives via POV from home with right lower quadrant pain for the last  3days. Reports fever to 10 at home. Reports emesis. Pt states today pain became more severe.

## 2019-01-22 ENCOUNTER — Other Ambulatory Visit: Payer: Self-pay | Admitting: *Deleted

## 2019-01-22 ENCOUNTER — Encounter: Payer: Self-pay | Admitting: Internal Medicine

## 2019-01-22 ENCOUNTER — Ambulatory Visit (INDEPENDENT_AMBULATORY_CARE_PROVIDER_SITE_OTHER): Payer: BC Managed Care – PPO | Admitting: Internal Medicine

## 2019-01-22 ENCOUNTER — Other Ambulatory Visit: Payer: BC Managed Care – PPO

## 2019-01-22 ENCOUNTER — Other Ambulatory Visit: Payer: Self-pay

## 2019-01-22 VITALS — BP 96/60 | HR 88 | Temp 98.9°F | Ht 65.0 in | Wt 179.0 lb

## 2019-01-22 DIAGNOSIS — R197 Diarrhea, unspecified: Secondary | ICD-10-CM | POA: Diagnosis not present

## 2019-01-22 DIAGNOSIS — N390 Urinary tract infection, site not specified: Secondary | ICD-10-CM | POA: Diagnosis not present

## 2019-01-22 DIAGNOSIS — R1084 Generalized abdominal pain: Secondary | ICD-10-CM

## 2019-01-22 DIAGNOSIS — R112 Nausea with vomiting, unspecified: Secondary | ICD-10-CM

## 2019-01-22 LAB — URINE CULTURE: Culture: NO GROWTH

## 2019-01-22 MED ORDER — PROMETHAZINE HCL 25 MG PO TABS: 25.0000 mg | ORAL_TABLET | Freq: Four times a day (QID) | ORAL | 1 refills | Status: DC | PRN

## 2019-01-22 MED ORDER — DICYCLOMINE HCL 20 MG PO TABS: 20.0000 mg | ORAL_TABLET | Freq: Four times a day (QID) | ORAL | 0 refills | Status: DC

## 2019-01-22 NOTE — Patient Instructions (Signed)
We have sent the following medications to your pharmacy for you to pick up at your convenience:  Bentyl, Phenergan.  Per Dr. Henrene Pastor, continue to rest and hydrate.

## 2019-01-22 NOTE — Progress Notes (Signed)
HISTORY OF PRESENT ILLNESS:  Donna Mcfarland is a 23 y.o. 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  Patient states she was in her usual state of health until 4 days ago when she awoke with nausea, vomiting, diarrhea, and abdominal pain.  Vomited once the following day but not since.  However has had constant nausea.  She describes episodic stabbing abdominal pain generally followed by diarrhea with only transient relief.  For these issues he was seen in the urgent care center then transferred to the emergency room.  Evaluation of her emergency room visit from 2 days ago shows unremarkable laboratories with normal comprehensive metabolic panel and CBC with differential.  Negative pregnancy test.  Urinalysis equivocal for urinary tract infection for which she was prescribed Keflex but has not started.  She was also prescribed dicyclomine which she took once.  She was prescribed Zofran but was not covered by her insurance formulary.  She did undergo a contrast-enhanced CT scan of the abdomen pelvis.  This was unremarkable.  Incidental bilateral L5 pars defect noted.  She has not had fever greater than 99.8.  No bleeding.  REVIEW OF SYSTEMS:  All non-GI ROS negative unless otherwise stated in the HPI  Past Medical History:  Diagnosis Date  . No pertinent past medical history     Past Surgical History:  Procedure Laterality Date  . TONSILLECTOMY      Social History [name]  reports that she has never smoked. She has never used smokeless tobacco. She reports current alcohol use. She reports current drug use. Frequency: 3.00 times per week. Drug: Marijuana.  family history includes Cancer in her paternal grandfather; Cholecystitis in her mother; Diabetes in her mother; Healthy in her brother and brother; Kidney Stones in her  father; Thyroid disease in her mother.  No Known Allergies     PHYSICAL EXAMINATION: Vital signs: BP 96/60 (BP Location: Left Arm, Patient Position: Sitting, Cuff Size: Normal)   Pulse 88   Temp 98.9 F (37.2 C)   Ht 5\' 5"  (1.651 m) Comment: height measured without shoes  Wt 179 lb (81.2 kg)   LMP 01/06/2019 (Approximate) Comment: neg preg  BMI 29.79 kg/m   Constitutional: Unwell appearing lying on the examining table, no acute distress Psychiatric: alert and oriented x3, cooperative Eyes: extraocular movements intact, anicteric, conjunctiva pink Mouth: oral pharynx moist, no lesions Neck: supple no lymphadenopathy Cardiovascular: heart regular rate and rhythm, no murmur Lungs: clear to auscultation bilaterally Abdomen: soft, very mild generalized tenderness, nondistended, no obvious ascites, no peritoneal signs, normal to mildly increased bowel sounds, no organomegaly Rectal: Omitted Extremities: no clubbing, cyanosis, or lower extremity edema bilaterally Skin: no lesions on visible extremities Neuro: No focal deficits.  Cranial nerves  ASSESSMENT:  1.  Acute viral gastroenteritis manifested by nausea, vomiting, diarrhea, abdominal pain.  Negative laboratories and imaging as described. 2.  Equivocal UTI   PLAN:  1.  Advised she could take dicyclomine 20 mg 4 times daily as needed.  Prescription refill provided 2.  Prescribe Phenergan 25 mg every 6 hours as needed 3.  Hydrate 4.  Rest 5.  Stool GI pathogen panel 6.  Initiate prescribed antibiotic for equivocal UTI after GI symptoms improved 7.  Advised I would expect her to improve over the course of days.  Contact the office for worsening or persistent problems.  They  live in [place] which is approximately 2 hours from here. 8.  The patient requests a work excuse note.  Provided

## 2019-01-26 ENCOUNTER — Telehealth: Payer: Self-pay | Admitting: Internal Medicine

## 2019-01-26 ENCOUNTER — Other Ambulatory Visit: Payer: Self-pay

## 2019-01-26 DIAGNOSIS — K529 Noninfective gastroenteritis and colitis, unspecified: Secondary | ICD-10-CM | POA: Diagnosis not present

## 2019-01-26 DIAGNOSIS — Z79899 Other long term (current) drug therapy: Secondary | ICD-10-CM | POA: Diagnosis not present

## 2019-01-26 DIAGNOSIS — R1011 Right upper quadrant pain: Secondary | ICD-10-CM | POA: Diagnosis not present

## 2019-01-26 DIAGNOSIS — E860 Dehydration: Secondary | ICD-10-CM | POA: Diagnosis not present

## 2019-01-26 DIAGNOSIS — R197 Diarrhea, unspecified: Secondary | ICD-10-CM | POA: Diagnosis not present

## 2019-01-26 DIAGNOSIS — R112 Nausea with vomiting, unspecified: Secondary | ICD-10-CM | POA: Diagnosis not present

## 2019-01-26 DIAGNOSIS — R109 Unspecified abdominal pain: Secondary | ICD-10-CM | POA: Diagnosis not present

## 2019-01-26 NOTE — Telephone Encounter (Signed)
Pts father states the patient has continued to have diarrhea. Reports anytime she eats in about 70min she has diarrhea. She is currently at [facility] receiving IV fluids. Father wanted to let Dr. Henrene Pastor know that her stools are green and there is a lot of mucous in them. Father is not sure if they will admit her or not. Dr. Henrene Pastor notified.

## 2019-01-26 NOTE — Telephone Encounter (Signed)
Noted.  I do not see that the GI pathogen panel that we ordered has been completed

## 2019-01-26 NOTE — Telephone Encounter (Signed)
Pts father called back and said they are sending pt home, she received 3 bags of IV fluids. [facility] also told them it may be a bad GI bug. Encouraged them to push gatorade and pedialyte.

## 2019-01-26 NOTE — Telephone Encounter (Signed)
Patient should submit GI pathogen panel as soon as possible

## 2019-01-26 NOTE — Telephone Encounter (Signed)
Left message to call back  

## 2019-01-27 NOTE — Telephone Encounter (Signed)
Call placed to the lab to check on the status of GI Pathogen panel.

## 2019-01-28 LAB — GASTROINTESTINAL PATHOGEN PANEL PCR
C. difficile Tox A/B, PCR: DETECTED — AB
Campylobacter, PCR: NOT DETECTED
Cryptosporidium, PCR: NOT DETECTED
E coli (ETEC) LT/ST PCR: NOT DETECTED
E coli (STEC) stx1/stx2, PCR: NOT DETECTED
E coli 0157, PCR: NOT DETECTED
Giardia lamblia, PCR: NOT DETECTED
Norovirus, PCR: NOT DETECTED
Rotavirus A, PCR: NOT DETECTED
Salmonella, PCR: NOT DETECTED
Shigella, PCR: NOT DETECTED

## 2019-01-29 ENCOUNTER — Telehealth: Payer: Self-pay | Admitting: Internal Medicine

## 2019-01-29 ENCOUNTER — Other Ambulatory Visit: Payer: Self-pay

## 2019-01-29 MED ORDER — VANCOMYCIN HCL 125 MG PO CAPS: 125.0000 mg | ORAL_CAPSULE | Freq: Four times a day (QID) | ORAL | 0 refills | Status: DC

## 2019-01-29 NOTE — Telephone Encounter (Signed)
See result note.  

## 2019-01-29 NOTE — Telephone Encounter (Signed)
Results are back

## 2019-03-01 DIAGNOSIS — N941 Unspecified dyspareunia: Secondary | ICD-10-CM | POA: Diagnosis not present

## 2019-03-01 DIAGNOSIS — N942 Vaginismus: Secondary | ICD-10-CM | POA: Diagnosis not present

## 2019-03-01 DIAGNOSIS — M6281 Muscle weakness (generalized): Secondary | ICD-10-CM | POA: Diagnosis not present

## 2019-03-01 DIAGNOSIS — M62838 Other muscle spasm: Secondary | ICD-10-CM | POA: Diagnosis not present

## 2019-03-02 DIAGNOSIS — N942 Vaginismus: Secondary | ICD-10-CM | POA: Diagnosis not present

## 2019-03-02 DIAGNOSIS — N941 Unspecified dyspareunia: Secondary | ICD-10-CM | POA: Diagnosis not present

## 2019-03-02 DIAGNOSIS — M62838 Other muscle spasm: Secondary | ICD-10-CM | POA: Diagnosis not present

## 2019-03-02 DIAGNOSIS — M6281 Muscle weakness (generalized): Secondary | ICD-10-CM | POA: Diagnosis not present

## 2019-03-08 DIAGNOSIS — M6281 Muscle weakness (generalized): Secondary | ICD-10-CM | POA: Diagnosis not present

## 2019-03-08 DIAGNOSIS — M62838 Other muscle spasm: Secondary | ICD-10-CM | POA: Diagnosis not present

## 2019-03-08 DIAGNOSIS — N39498 Other specified urinary incontinence: Secondary | ICD-10-CM | POA: Diagnosis not present

## 2019-03-08 DIAGNOSIS — N942 Vaginismus: Secondary | ICD-10-CM | POA: Diagnosis not present

## 2019-03-15 DIAGNOSIS — N39498 Other specified urinary incontinence: Secondary | ICD-10-CM | POA: Diagnosis not present

## 2019-03-15 DIAGNOSIS — M62838 Other muscle spasm: Secondary | ICD-10-CM | POA: Diagnosis not present

## 2019-03-15 DIAGNOSIS — M6281 Muscle weakness (generalized): Secondary | ICD-10-CM | POA: Diagnosis not present

## 2019-03-15 DIAGNOSIS — R350 Frequency of micturition: Secondary | ICD-10-CM | POA: Diagnosis not present

## 2019-03-17 DIAGNOSIS — R110 Nausea: Secondary | ICD-10-CM | POA: Diagnosis not present

## 2019-03-17 DIAGNOSIS — K580 Irritable bowel syndrome with diarrhea: Secondary | ICD-10-CM | POA: Diagnosis not present

## 2019-03-17 DIAGNOSIS — K625 Hemorrhage of anus and rectum: Secondary | ICD-10-CM | POA: Diagnosis not present

## 2019-03-22 DIAGNOSIS — M6281 Muscle weakness (generalized): Secondary | ICD-10-CM | POA: Diagnosis not present

## 2019-03-22 DIAGNOSIS — M62838 Other muscle spasm: Secondary | ICD-10-CM | POA: Diagnosis not present

## 2019-03-22 DIAGNOSIS — R350 Frequency of micturition: Secondary | ICD-10-CM | POA: Diagnosis not present

## 2019-03-22 DIAGNOSIS — N39498 Other specified urinary incontinence: Secondary | ICD-10-CM | POA: Diagnosis not present

## 2019-04-09 DIAGNOSIS — Z113 Encounter for screening for infections with a predominantly sexual mode of transmission: Secondary | ICD-10-CM | POA: Diagnosis not present

## 2019-04-09 DIAGNOSIS — Z1159 Encounter for screening for other viral diseases: Secondary | ICD-10-CM | POA: Diagnosis not present

## 2019-04-09 DIAGNOSIS — Z0000 Encounter for general adult medical examination without abnormal findings: Secondary | ICD-10-CM | POA: Diagnosis not present

## 2019-04-22 DIAGNOSIS — N39498 Other specified urinary incontinence: Secondary | ICD-10-CM | POA: Diagnosis not present

## 2019-04-22 DIAGNOSIS — M62838 Other muscle spasm: Secondary | ICD-10-CM | POA: Diagnosis not present

## 2019-04-22 DIAGNOSIS — N942 Vaginismus: Secondary | ICD-10-CM | POA: Diagnosis not present

## 2019-04-22 DIAGNOSIS — M6281 Muscle weakness (generalized): Secondary | ICD-10-CM | POA: Diagnosis not present

## 2019-05-24 DIAGNOSIS — N942 Vaginismus: Secondary | ICD-10-CM | POA: Diagnosis not present

## 2019-05-24 DIAGNOSIS — M62838 Other muscle spasm: Secondary | ICD-10-CM | POA: Diagnosis not present

## 2019-05-24 DIAGNOSIS — N941 Unspecified dyspareunia: Secondary | ICD-10-CM | POA: Diagnosis not present

## 2019-05-24 DIAGNOSIS — M6281 Muscle weakness (generalized): Secondary | ICD-10-CM | POA: Diagnosis not present

## 2019-06-03 DIAGNOSIS — M6281 Muscle weakness (generalized): Secondary | ICD-10-CM | POA: Diagnosis not present

## 2019-06-03 DIAGNOSIS — N941 Unspecified dyspareunia: Secondary | ICD-10-CM | POA: Diagnosis not present

## 2019-06-03 DIAGNOSIS — M62838 Other muscle spasm: Secondary | ICD-10-CM | POA: Diagnosis not present

## 2019-06-03 DIAGNOSIS — N942 Vaginismus: Secondary | ICD-10-CM | POA: Diagnosis not present

## 2019-06-14 DIAGNOSIS — N942 Vaginismus: Secondary | ICD-10-CM | POA: Diagnosis not present

## 2019-06-14 DIAGNOSIS — N941 Unspecified dyspareunia: Secondary | ICD-10-CM | POA: Diagnosis not present

## 2019-06-14 DIAGNOSIS — M62838 Other muscle spasm: Secondary | ICD-10-CM | POA: Diagnosis not present

## 2019-06-14 DIAGNOSIS — M6281 Muscle weakness (generalized): Secondary | ICD-10-CM | POA: Diagnosis not present

## 2019-06-15 ENCOUNTER — Ambulatory Visit
Admission: EM | Admit: 2019-06-15 | Discharge: 2019-06-15 | Disposition: A | Discharge status: Home / Self Care | Payer: BC Managed Care – PPO | Attending: Physician Assistant | Admitting: Physician Assistant

## 2019-06-15 ENCOUNTER — Other Ambulatory Visit: Payer: Self-pay

## 2019-06-15 ENCOUNTER — Encounter: Payer: Self-pay | Admitting: Physician Assistant

## 2019-06-15 DIAGNOSIS — N3090 Cystitis, unspecified without hematuria: Secondary | ICD-10-CM

## 2019-06-15 LAB — POCT URINALYSIS DIP (MANUAL ENTRY)
Bilirubin, UA: NEGATIVE
Blood, UA: NEGATIVE
Glucose, UA: NEGATIVE mg/dL
Ketones, POC UA: NEGATIVE mg/dL
Nitrite, UA: NEGATIVE
Protein Ur, POC: NEGATIVE mg/dL
Spec Grav, UA: >=1.03 — AB (ref 1.010–1.025)
Urobilinogen, UA: 0.2 E.U./dL
pH, UA: 7 (ref 5.0–8.0)

## 2019-06-15 MED ORDER — NITROFURANTOIN MONOHYD MACRO 100 MG PO CAPS: 100.0000 mg | ORAL_CAPSULE | Freq: Two times a day (BID) | ORAL | 0 refills | Status: AC

## 2019-06-15 NOTE — ED Triage Notes (Signed)
Provider triage  

## 2019-06-15 NOTE — Discharge Instructions (Signed)
Your urine was positive for an urinary tract infection. Start macrobid as directed. Keep hydrated, urine should be clear to pale yellow in color. Monitor for any worsening of symptoms, fever, worsening abdominal pain, nausea/vomiting, flank pain, follow up for reevaluation.

## 2019-06-15 NOTE — ED Provider Notes (Signed)
[name]    CSN: [id number] Arrival date & time: 06/15/19  1909      History   Chief Complaint Chief Complaint  Patient presents with  . Dysuria    HPI Donna Mcfarland is a 24 y.o. female.   24 year old female comes in for 5 day history of urinary symptoms. Has had urinary frequency, dysuria, urinary urgency. Also has mild urinary hesitancy. Denies hematuria. Had suprapubic abdominal cramping that has since resolved. Low back pain. Nausea without vomiting. Denies fevers. Vaginal discharge for the past few days that is consistent with her normal discharge prior to cycles. Denies vaginal itching. LMP 05/25/2019. Sexually active with one female partner, no vaginal penetration. AZO with good relief of dysuria.     Past Medical History:  Diagnosis Date  . No pertinent past medical history     There are no problems to display for this patient.   Past Surgical History:  Procedure Laterality Date  . TONSILLECTOMY      OB History   No obstetric history on file.      Home Medications    Prior to Admission medications   Medication Sig Start Date End Date Taking? Authorizing Provider  ibuprofen (ADVIL) 200 MG tablet Take 200 mg by mouth every 6 (six) hours as needed.    [provider]  nitrofurantoin, macrocrystal-monohydrate, (MACROBID) 100 MG capsule Take 1 capsule (100 mg total) by mouth 2 (two) times daily. 06/15/19   Cathie Hoops, Amy V, PA-C  dicyclomine (BENTYL) 20 MG tablet Take 1 tablet (20 mg total) by mouth 4 (four) times daily. 01/22/19 06/15/19  Hilarie Fredrickson, MD  promethazine (PHENERGAN) 25 MG tablet Take 1 tablet (25 mg total) by mouth every 6 (six) hours as needed for nausea or vomiting. 01/22/19 06/15/19  Hilarie Fredrickson, MD    Family History Family History  Problem Relation Age of Onset  . Diabetes Mother   . Thyroid disease Mother   . Cholecystitis Mother   . Kidney Stones Father   . Healthy Brother   . Healthy Brother   . Cancer  Paternal Grandfather        Eye    Social History Social History   Tobacco Use  . Smoking status: Never Smoker  . Smokeless tobacco: Never Used  Substance Use Topics  . Alcohol use: Yes    Comment: socially  . Drug use: Yes    Frequency: 3.0 times per week    Types: Marijuana     Allergies   Morphine and related   Review of Systems Review of Systems  Reason unable to perform ROS: See HPI as above.     Physical Exam Triage Vital Signs ED Triage Vitals  Enc Vitals Group     BP      Pulse      Resp      Temp      Temp src      SpO2      Weight      Height      Head Circumference      Peak Flow      Pain Score      Pain Loc      Pain Edu?      Excl. in GC?    No data found.  Updated Vital Signs BP 102/66 (BP Location: Right Arm)   Pulse 66   Temp 98 F (36.7 C) (Oral)   Resp 18   SpO2 97%  Visual Acuity Right Eye Distance:   Left Eye Distance:   Bilateral Distance:    Right Eye Near:   Left Eye Near:    Bilateral Near:     Physical Exam Constitutional:      General: She is not in acute distress.    Appearance: She is well-developed. She is not ill-appearing, toxic-appearing or diaphoretic.  HENT:     Head: Normocephalic and atraumatic.  Eyes:     Conjunctiva/sclera: Conjunctivae normal.     Pupils: Pupils are equal, round, and reactive to light.  Cardiovascular:     Rate and Rhythm: Normal rate and regular rhythm.  Pulmonary:     Effort: Pulmonary effort is normal. No respiratory distress.     Comments: LCTAB Abdominal:     General: Bowel sounds are normal.     Palpations: Abdomen is soft.     Tenderness: There is no abdominal tenderness. There is no right CVA tenderness, left CVA tenderness, guarding or rebound.  Musculoskeletal:     Cervical back: Normal range of motion and neck supple.  Skin:    General: Skin is warm and dry.  Neurological:     Mental Status: She is alert and oriented to person, place, and time.  Psychiatric:         Behavior: Behavior normal.        Judgment: Judgment normal.      UC Treatments / Results  Labs (all labs ordered are listed, but only abnormal results are displayed) Labs Reviewed  POCT URINALYSIS DIP (MANUAL ENTRY) - Abnormal; Notable for the following components:      Result Value   Spec Grav, UA >=1.030 (*)    Leukocytes, UA Trace (*)    All other components within normal limits  URINE CULTURE    EKG   Radiology No results found.  Procedures Procedures (including critical care time)  Medications Ordered in UC Medications - No data to display  Initial Impression / Assessment and Plan / UC Course  I have reviewed the triage vital signs and the nursing notes.  Pertinent labs & imaging results that were available during my care of the patient were reviewed by me and considered in my medical decision making (see chart for details).    Dipstick with trace leuks. Patient with history of cdiff after abx. Record review shows patient had keflex prior to cdiff dx. Will refrain from keflex for now. Will start macrobid for UTI. Patient to continue probiotics. Return precautions given. Patient expresses understanding and agrees to plan.  Final Clinical Impressions(s) / UC Diagnoses   Final diagnoses:  Cystitis    ED Prescriptions    Medication Sig Dispense Auth. Provider   nitrofurantoin, macrocrystal-monohydrate, (MACROBID) 100 MG capsule Take 1 capsule (100 mg total) by mouth 2 (two) times daily. 10 capsule Ok Edwards, PA-C     PDMP not reviewed this encounter.   Ok Edwards, PA-C 06/16/19 1431

## 2019-06-16 DIAGNOSIS — M62838 Other muscle spasm: Secondary | ICD-10-CM | POA: Diagnosis not present

## 2019-06-16 DIAGNOSIS — M6281 Muscle weakness (generalized): Secondary | ICD-10-CM | POA: Diagnosis not present

## 2019-06-16 DIAGNOSIS — M6289 Other specified disorders of muscle: Secondary | ICD-10-CM | POA: Diagnosis not present

## 2019-06-16 DIAGNOSIS — N941 Unspecified dyspareunia: Secondary | ICD-10-CM | POA: Diagnosis not present

## 2019-06-17 LAB — URINE CULTURE: Culture: NO GROWTH

## 2019-06-22 DIAGNOSIS — M62838 Other muscle spasm: Secondary | ICD-10-CM | POA: Diagnosis not present

## 2019-06-22 DIAGNOSIS — N941 Unspecified dyspareunia: Secondary | ICD-10-CM | POA: Diagnosis not present

## 2019-06-22 DIAGNOSIS — M6289 Other specified disorders of muscle: Secondary | ICD-10-CM | POA: Diagnosis not present

## 2019-06-22 DIAGNOSIS — M6281 Muscle weakness (generalized): Secondary | ICD-10-CM | POA: Diagnosis not present

## 2019-07-02 DIAGNOSIS — M6289 Other specified disorders of muscle: Secondary | ICD-10-CM | POA: Diagnosis not present

## 2019-07-02 DIAGNOSIS — M6281 Muscle weakness (generalized): Secondary | ICD-10-CM | POA: Diagnosis not present

## 2019-07-02 DIAGNOSIS — R102 Pelvic and perineal pain: Secondary | ICD-10-CM | POA: Diagnosis not present

## 2019-07-02 DIAGNOSIS — M62838 Other muscle spasm: Secondary | ICD-10-CM | POA: Diagnosis not present

## 2019-07-05 DIAGNOSIS — M62838 Other muscle spasm: Secondary | ICD-10-CM | POA: Diagnosis not present

## 2019-07-05 DIAGNOSIS — M6289 Other specified disorders of muscle: Secondary | ICD-10-CM | POA: Diagnosis not present

## 2019-07-05 DIAGNOSIS — M6281 Muscle weakness (generalized): Secondary | ICD-10-CM | POA: Diagnosis not present

## 2019-07-05 DIAGNOSIS — R102 Pelvic and perineal pain: Secondary | ICD-10-CM | POA: Diagnosis not present

## 2019-07-14 DIAGNOSIS — R1011 Right upper quadrant pain: Secondary | ICD-10-CM | POA: Diagnosis not present

## 2019-07-14 DIAGNOSIS — R102 Pelvic and perineal pain: Secondary | ICD-10-CM | POA: Diagnosis not present

## 2019-07-14 DIAGNOSIS — L282 Other prurigo: Secondary | ICD-10-CM | POA: Diagnosis not present

## 2019-07-14 DIAGNOSIS — R110 Nausea: Secondary | ICD-10-CM | POA: Diagnosis not present

## 2019-07-14 DIAGNOSIS — M6281 Muscle weakness (generalized): Secondary | ICD-10-CM | POA: Diagnosis not present

## 2019-07-14 DIAGNOSIS — N941 Unspecified dyspareunia: Secondary | ICD-10-CM | POA: Diagnosis not present

## 2019-07-14 DIAGNOSIS — M62838 Other muscle spasm: Secondary | ICD-10-CM | POA: Diagnosis not present

## 2019-07-14 DIAGNOSIS — K589 Irritable bowel syndrome without diarrhea: Secondary | ICD-10-CM | POA: Diagnosis not present

## 2019-07-30 DIAGNOSIS — M6281 Muscle weakness (generalized): Secondary | ICD-10-CM | POA: Diagnosis not present

## 2019-07-30 DIAGNOSIS — M62838 Other muscle spasm: Secondary | ICD-10-CM | POA: Diagnosis not present

## 2019-07-30 DIAGNOSIS — N941 Unspecified dyspareunia: Secondary | ICD-10-CM | POA: Diagnosis not present

## 2019-07-30 DIAGNOSIS — M6289 Other specified disorders of muscle: Secondary | ICD-10-CM | POA: Diagnosis not present

## 2019-08-04 DIAGNOSIS — M62838 Other muscle spasm: Secondary | ICD-10-CM | POA: Diagnosis not present

## 2019-08-04 DIAGNOSIS — M6289 Other specified disorders of muscle: Secondary | ICD-10-CM | POA: Diagnosis not present

## 2019-08-04 DIAGNOSIS — R102 Pelvic and perineal pain: Secondary | ICD-10-CM | POA: Diagnosis not present

## 2019-08-04 DIAGNOSIS — N941 Unspecified dyspareunia: Secondary | ICD-10-CM | POA: Diagnosis not present

## 2019-08-13 DIAGNOSIS — M6281 Muscle weakness (generalized): Secondary | ICD-10-CM | POA: Diagnosis not present

## 2019-08-13 DIAGNOSIS — R102 Pelvic and perineal pain: Secondary | ICD-10-CM | POA: Diagnosis not present

## 2019-08-13 DIAGNOSIS — M62838 Other muscle spasm: Secondary | ICD-10-CM | POA: Diagnosis not present

## 2019-08-13 DIAGNOSIS — N941 Unspecified dyspareunia: Secondary | ICD-10-CM | POA: Diagnosis not present

## 2019-08-17 DIAGNOSIS — N941 Unspecified dyspareunia: Secondary | ICD-10-CM | POA: Diagnosis not present

## 2019-08-17 DIAGNOSIS — M6281 Muscle weakness (generalized): Secondary | ICD-10-CM | POA: Diagnosis not present

## 2019-08-17 DIAGNOSIS — M62838 Other muscle spasm: Secondary | ICD-10-CM | POA: Diagnosis not present

## 2019-08-17 DIAGNOSIS — M6289 Other specified disorders of muscle: Secondary | ICD-10-CM | POA: Diagnosis not present

## 2019-09-01 DIAGNOSIS — M6289 Other specified disorders of muscle: Secondary | ICD-10-CM | POA: Diagnosis not present

## 2019-09-01 DIAGNOSIS — N941 Unspecified dyspareunia: Secondary | ICD-10-CM | POA: Diagnosis not present

## 2019-09-01 DIAGNOSIS — M62838 Other muscle spasm: Secondary | ICD-10-CM | POA: Diagnosis not present

## 2019-09-01 DIAGNOSIS — R102 Pelvic and perineal pain: Secondary | ICD-10-CM | POA: Diagnosis not present

## 2019-09-14 DIAGNOSIS — R102 Pelvic and perineal pain: Secondary | ICD-10-CM | POA: Diagnosis not present

## 2019-09-14 DIAGNOSIS — M6281 Muscle weakness (generalized): Secondary | ICD-10-CM | POA: Diagnosis not present

## 2019-09-14 DIAGNOSIS — N941 Unspecified dyspareunia: Secondary | ICD-10-CM | POA: Diagnosis not present

## 2019-09-14 DIAGNOSIS — M62838 Other muscle spasm: Secondary | ICD-10-CM | POA: Diagnosis not present

## 2019-09-24 DIAGNOSIS — M6281 Muscle weakness (generalized): Secondary | ICD-10-CM | POA: Diagnosis not present

## 2019-09-24 DIAGNOSIS — M6289 Other specified disorders of muscle: Secondary | ICD-10-CM | POA: Diagnosis not present

## 2019-09-24 DIAGNOSIS — N941 Unspecified dyspareunia: Secondary | ICD-10-CM | POA: Diagnosis not present

## 2019-09-24 DIAGNOSIS — M62838 Other muscle spasm: Secondary | ICD-10-CM | POA: Diagnosis not present

## 2019-10-20 DIAGNOSIS — N941 Unspecified dyspareunia: Secondary | ICD-10-CM | POA: Diagnosis not present

## 2019-10-20 DIAGNOSIS — M6281 Muscle weakness (generalized): Secondary | ICD-10-CM | POA: Diagnosis not present

## 2019-10-20 DIAGNOSIS — R102 Pelvic and perineal pain: Secondary | ICD-10-CM | POA: Diagnosis not present

## 2019-10-20 DIAGNOSIS — M62838 Other muscle spasm: Secondary | ICD-10-CM | POA: Diagnosis not present

## 2019-10-21 DIAGNOSIS — R300 Dysuria: Secondary | ICD-10-CM | POA: Diagnosis not present

## 2019-10-21 DIAGNOSIS — R102 Pelvic and perineal pain: Secondary | ICD-10-CM | POA: Diagnosis not present

## 2019-10-26 DIAGNOSIS — N941 Unspecified dyspareunia: Secondary | ICD-10-CM | POA: Diagnosis not present

## 2019-10-26 DIAGNOSIS — M62838 Other muscle spasm: Secondary | ICD-10-CM | POA: Diagnosis not present

## 2019-10-26 DIAGNOSIS — M6289 Other specified disorders of muscle: Secondary | ICD-10-CM | POA: Diagnosis not present

## 2019-10-26 DIAGNOSIS — R102 Pelvic and perineal pain: Secondary | ICD-10-CM | POA: Diagnosis not present

## 2019-11-23 DIAGNOSIS — M6281 Muscle weakness (generalized): Secondary | ICD-10-CM | POA: Diagnosis not present

## 2019-11-23 DIAGNOSIS — M62838 Other muscle spasm: Secondary | ICD-10-CM | POA: Diagnosis not present

## 2019-11-23 DIAGNOSIS — R102 Pelvic and perineal pain: Secondary | ICD-10-CM | POA: Diagnosis not present

## 2019-11-23 DIAGNOSIS — M6289 Other specified disorders of muscle: Secondary | ICD-10-CM | POA: Diagnosis not present

## 2019-12-22 DIAGNOSIS — M6289 Other specified disorders of muscle: Secondary | ICD-10-CM | POA: Diagnosis not present

## 2019-12-22 DIAGNOSIS — M6281 Muscle weakness (generalized): Secondary | ICD-10-CM | POA: Diagnosis not present

## 2019-12-22 DIAGNOSIS — N941 Unspecified dyspareunia: Secondary | ICD-10-CM | POA: Diagnosis not present

## 2019-12-22 DIAGNOSIS — M62838 Other muscle spasm: Secondary | ICD-10-CM | POA: Diagnosis not present

## 2019-12-29 DIAGNOSIS — M6289 Other specified disorders of muscle: Secondary | ICD-10-CM | POA: Diagnosis not present

## 2019-12-29 DIAGNOSIS — M6281 Muscle weakness (generalized): Secondary | ICD-10-CM | POA: Diagnosis not present

## 2019-12-29 DIAGNOSIS — R102 Pelvic and perineal pain: Secondary | ICD-10-CM | POA: Diagnosis not present

## 2019-12-29 DIAGNOSIS — M62838 Other muscle spasm: Secondary | ICD-10-CM | POA: Diagnosis not present

## 2020-01-03 DIAGNOSIS — M25531 Pain in right wrist: Secondary | ICD-10-CM | POA: Diagnosis not present

## 2020-01-03 DIAGNOSIS — M25572 Pain in left ankle and joints of left foot: Secondary | ICD-10-CM | POA: Diagnosis not present

## 2020-01-03 DIAGNOSIS — M25571 Pain in right ankle and joints of right foot: Secondary | ICD-10-CM | POA: Diagnosis not present

## 2020-01-18 DIAGNOSIS — R0781 Pleurodynia: Secondary | ICD-10-CM | POA: Diagnosis not present

## 2020-01-18 DIAGNOSIS — M25512 Pain in left shoulder: Secondary | ICD-10-CM | POA: Diagnosis not present

## 2020-01-18 DIAGNOSIS — M542 Cervicalgia: Secondary | ICD-10-CM | POA: Diagnosis not present

## 2020-01-18 DIAGNOSIS — S161XXA Strain of muscle, fascia and tendon at neck level, initial encounter: Secondary | ICD-10-CM | POA: Diagnosis not present

## 2020-02-24 ENCOUNTER — Ambulatory Visit
Admission: EM | Admit: 2020-02-24 | Discharge: 2020-02-24 | Disposition: A | Discharge status: Home / Self Care | Payer: BC Managed Care – PPO | Attending: Internal Medicine | Admitting: Internal Medicine

## 2020-02-24 ENCOUNTER — Other Ambulatory Visit: Payer: Self-pay

## 2020-02-24 DIAGNOSIS — Z4800 Encounter for change or removal of nonsurgical wound dressing: Secondary | ICD-10-CM | POA: Diagnosis not present

## 2020-02-24 MED ORDER — BACITRACIN ZINC 500 UNIT/GM EX OINT: 1.0000 "application " | TOPICAL_OINTMENT | Freq: Two times a day (BID) | CUTANEOUS | 0 refills | Status: AC

## 2020-02-24 NOTE — ED Triage Notes (Signed)
Pt said she fell and hurt her right knee and now there eis a bandage stuck and wanted to get help to get it off. Pt has been taking care of wound but painful to removed.

## 2020-02-24 NOTE — Discharge Instructions (Signed)
Apply Vaseline or topical antibiotic ointment when doing dressing changes Nonocclusive dressing when you are at home.

## 2020-02-28 NOTE — ED Provider Notes (Signed)
[name]    CSN: [id number] Arrival date & time: 02/24/20  1826      History   Chief Complaint Chief Complaint  Patient presents with  . Fall  . Knee Pain    HPI Donna Mcfarland is a 25 y.o. female comes to the urgent care for bandage removal.  From the wound.  Patient fell on her knee and developed abrasions.  She has been using an adhesive gauze to dress the wound.  Patient noticed earlier today that the gauze was adherent to the wound and she was unable to remove it.  Attempts to remove the gauze was painful.  She comes to the urgent care for help to remove the gauze.  No redness or discharge from the wound.Donna Mcfarland   HPI  Past Medical History:  Diagnosis Date  . No pertinent past medical history     There are no problems to display for this patient.   Past Surgical History:  Procedure Laterality Date  . TONSILLECTOMY      OB History   No obstetric history on file.      Home Medications    Prior to Admission medications   Medication Sig Start Date End Date Taking? Authorizing Provider  bacitracin ointment Apply 1 application topically 2 (two) times daily. 02/24/20  Yes Saniyya Gau, Britta Mccreedy, MD  ibuprofen (ADVIL) 200 MG tablet Take 200 mg by mouth every 6 (six) hours as needed.    [provider]  nitrofurantoin, macrocrystal-monohydrate, (MACROBID) 100 MG capsule Take 1 capsule (100 mg total) by mouth 2 (two) times daily. 06/15/19   Cathie Hoops, Amy V, PA-C  dicyclomine (BENTYL) 20 MG tablet Take 1 tablet (20 mg total) by mouth 4 (four) times daily. 01/22/19 06/15/19  Hilarie Fredrickson, MD  promethazine (PHENERGAN) 25 MG tablet Take 1 tablet (25 mg total) by mouth every 6 (six) hours as needed for nausea or vomiting. 01/22/19 06/15/19  Hilarie Fredrickson, MD    Family History Family History  Problem Relation Age of Onset  . Diabetes Mother   . Thyroid disease Mother   . Cholecystitis Mother   . Kidney Stones Father   . Healthy Brother   . Healthy Brother    . Cancer Paternal Grandfather        Eye    Social History Social History   Tobacco Use  . Smoking status: Never Smoker  . Smokeless tobacco: Never Used  Substance Use Topics  . Alcohol use: Yes    Comment: socially  . Drug use: Yes    Frequency: 3.0 times per week    Types: Marijuana     Allergies   Morphine and related   Review of Systems Review of Systems  Constitutional: Negative for fever.  Musculoskeletal: Negative for back pain and joint swelling.  Skin: Positive for wound. Negative for color change and rash.     Physical Exam Triage Vital Signs ED Triage Vitals  Enc Vitals Group     BP 02/24/20 2009 126/76     Pulse Rate 02/24/20 2009 67     Resp 02/24/20 2009 16     Temp 02/24/20 2009 98.4 F (36.9 C)     Temp Source 02/24/20 2009 Oral     SpO2 02/24/20 2009 99 %     Weight --      Height --      Head Circumference --      Peak Flow --      Pain Score 02/24/20  1910 7     Pain Loc --      Pain Edu? --      Excl. in GC? --    No data found.  Updated Vital Signs BP 126/76 (BP Location: Right Arm)   Pulse 67   Temp 98.4 F (36.9 C) (Oral)   Resp 16   LMP 02/08/2020   SpO2 99%   Visual Acuity Right Eye Distance:   Left Eye Distance:   Bilateral Distance:    Right Eye Near:   Left Eye Near:    Bilateral Near:     Physical Exam Skin:    Findings: Lesion present.     Comments: Superficial wound on the right knee with adherent gauze. No discharge or surrounding erythema      UC Treatments / Results  Labs (all labs ordered are listed, but only abnormal results are displayed) Labs Reviewed - No data to display  EKG   Radiology No results found.  Procedures Procedures (including critical care time)  Medications Ordered in UC Medications - No data to display  Initial Impression / Assessment and Plan / UC Course  I have reviewed the triage vital signs and the nursing notes.  Pertinent labs & imaging results that were  available during my care of the patient were reviewed by me and considered in my medical decision making (see chart for details).     1. Removal of nonsurgical wound dressing Dressing removed Patient advised to use nonadherent dressing Tylenol as needed for pain Final Clinical Impressions(s) / UC Diagnoses   Final diagnoses:  Change or removal of nonsurgical wound dressing     Discharge Instructions     Apply Vaseline or topical antibiotic ointment when doing dressing changes Nonocclusive dressing when you are at home.   ED Prescriptions    Medication Sig Dispense Auth. Provider   bacitracin ointment Apply 1 application topically 2 (two) times daily. 120 g Dravon Nott, Britta Mccreedy, MD     PDMP not reviewed this encounter.   Merrilee Jansky, MD 02/28/20 2257

## 2020-05-29 DIAGNOSIS — R1310 Dysphagia, unspecified: Secondary | ICD-10-CM | POA: Diagnosis not present

## 2020-05-29 DIAGNOSIS — Z7251 High risk heterosexual behavior: Secondary | ICD-10-CM | POA: Diagnosis not present

## 2020-05-29 DIAGNOSIS — R42 Dizziness and giddiness: Secondary | ICD-10-CM | POA: Diagnosis not present

## 2020-05-29 DIAGNOSIS — J029 Acute pharyngitis, unspecified: Secondary | ICD-10-CM | POA: Diagnosis not present

## 2020-05-29 DIAGNOSIS — R509 Fever, unspecified: Secondary | ICD-10-CM | POA: Diagnosis not present

## 2020-07-10 DIAGNOSIS — T781XXA Other adverse food reactions, not elsewhere classified, initial encounter: Secondary | ICD-10-CM | POA: Diagnosis not present

## 2020-07-10 DIAGNOSIS — Z0001 Encounter for general adult medical examination with abnormal findings: Secondary | ICD-10-CM | POA: Diagnosis not present

## 2020-07-10 DIAGNOSIS — E785 Hyperlipidemia, unspecified: Secondary | ICD-10-CM | POA: Diagnosis not present

## 2020-07-10 DIAGNOSIS — R5383 Other fatigue: Secondary | ICD-10-CM | POA: Diagnosis not present

## 2020-07-10 DIAGNOSIS — L282 Other prurigo: Secondary | ICD-10-CM | POA: Diagnosis not present

## 2020-08-15 DIAGNOSIS — T781XXD Other adverse food reactions, not elsewhere classified, subsequent encounter: Secondary | ICD-10-CM | POA: Diagnosis not present

## 2020-08-15 DIAGNOSIS — R21 Rash and other nonspecific skin eruption: Secondary | ICD-10-CM | POA: Diagnosis not present

## 2020-08-18 DIAGNOSIS — H04123 Dry eye syndrome of bilateral lacrimal glands: Secondary | ICD-10-CM | POA: Diagnosis not present

## 2020-08-22 DIAGNOSIS — Z6830 Body mass index (BMI) 30.0-30.9, adult: Secondary | ICD-10-CM | POA: Diagnosis not present

## 2020-08-22 DIAGNOSIS — L449 Papulosquamous disorder, unspecified: Secondary | ICD-10-CM | POA: Diagnosis not present

## 2020-08-22 DIAGNOSIS — L282 Other prurigo: Secondary | ICD-10-CM | POA: Diagnosis not present

## 2020-08-22 DIAGNOSIS — M25552 Pain in left hip: Secondary | ICD-10-CM | POA: Diagnosis not present

## 2020-10-20 DIAGNOSIS — J209 Acute bronchitis, unspecified: Secondary | ICD-10-CM | POA: Diagnosis not present

## 2020-10-20 DIAGNOSIS — R051 Acute cough: Secondary | ICD-10-CM | POA: Diagnosis not present

## 2020-10-20 DIAGNOSIS — Z20822 Contact with and (suspected) exposure to covid-19: Secondary | ICD-10-CM | POA: Diagnosis not present

## 2020-10-20 DIAGNOSIS — R0981 Nasal congestion: Secondary | ICD-10-CM | POA: Diagnosis not present

## 2020-12-11 DIAGNOSIS — L438 Other lichen planus: Secondary | ICD-10-CM | POA: Diagnosis not present

## 2021-01-13 IMAGING — DX PORTABLE CHEST - 1 VIEW
1 series · 1 of 1 positions shown · non-contrast
Comparison: None.

CLINICAL DATA: Nausea and fevers

EXAM:
PORTABLE CHEST 1 VIEW

[chest ap]
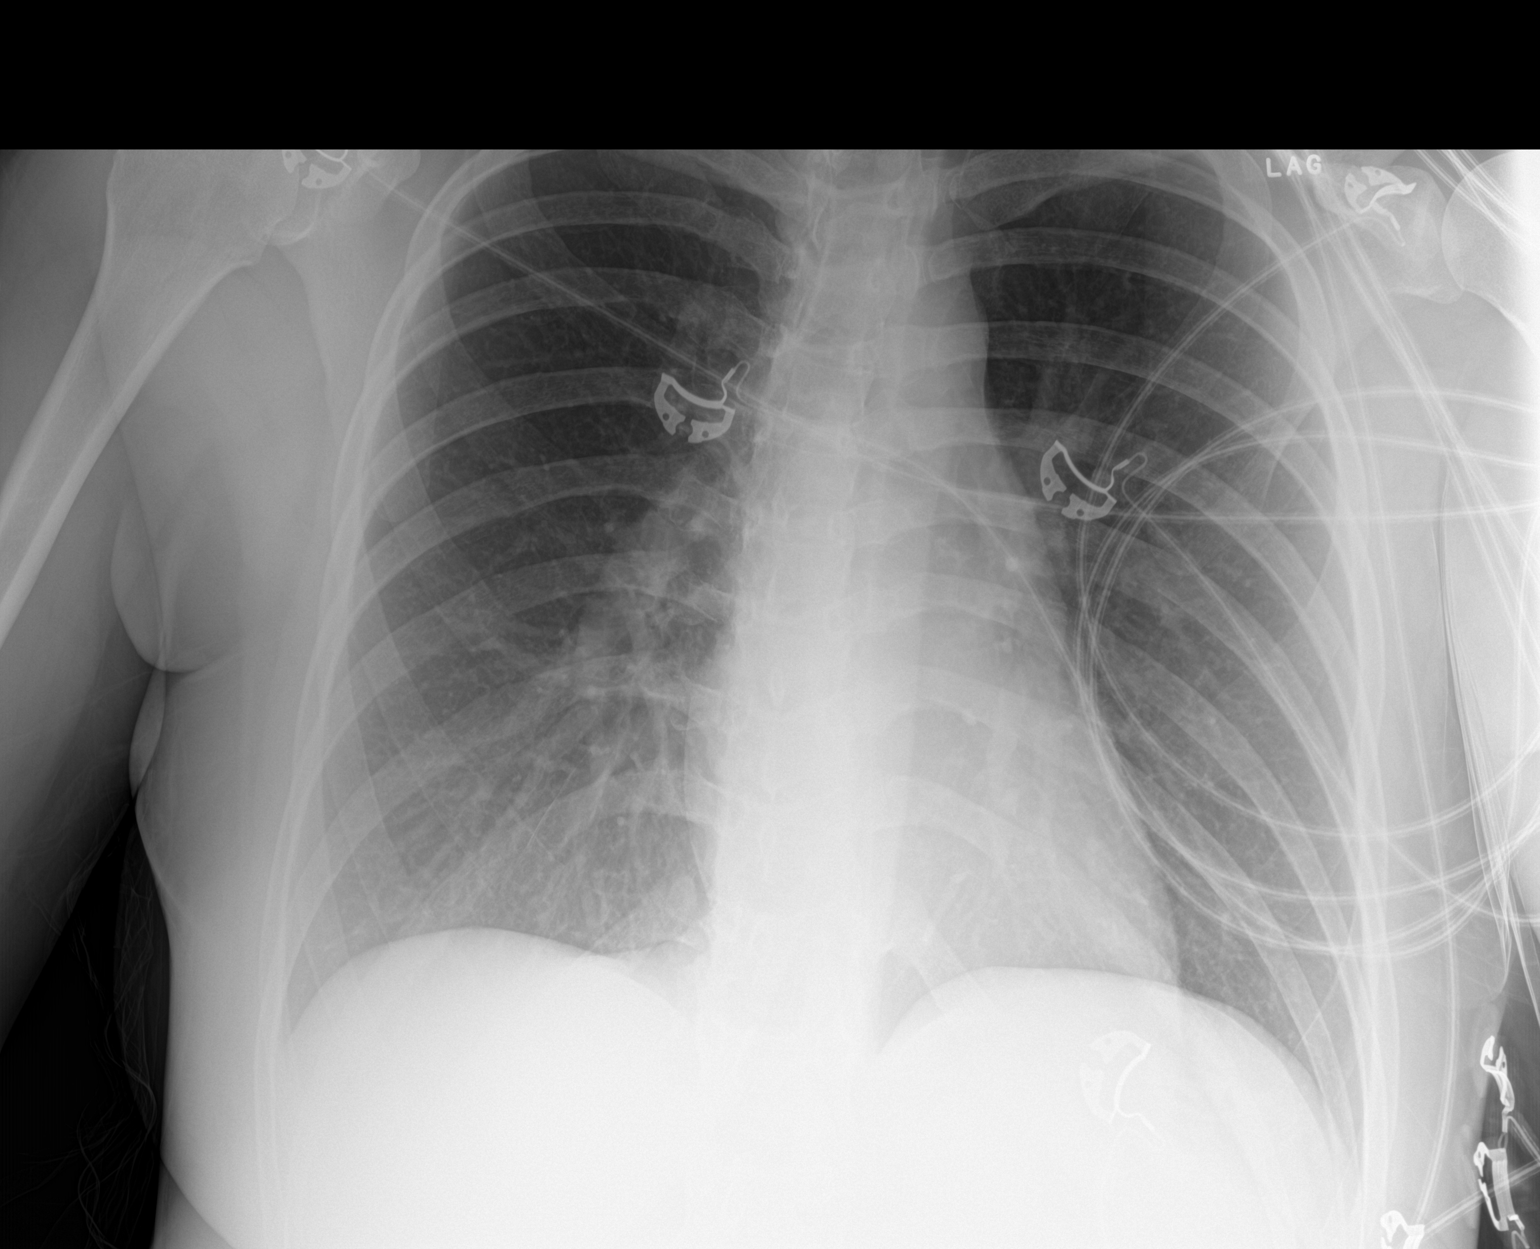

[1 of 1 positions shown; findings below may reference images not displayed]

FINDINGS: The heart size and mediastinal contours are within normal limits.
Both lungs are clear. The visualized skeletal structures are
unremarkable.
IMPRESSION: No active disease.
